# Patient Record
Sex: Male | Born: 1950 | Race: White | Hispanic: No | Smoking: Current every day smoker
Health system: Southern US, Community
[De-identification: ages and names within clinical notes are randomized; demographics above are authoritative.]

## PROBLEM LIST (undated history)

## (undated) DIAGNOSIS — T8859XA Other complications of anesthesia, initial encounter: Secondary | ICD-10-CM

## (undated) DIAGNOSIS — Z8619 Personal history of other infectious and parasitic diseases: Secondary | ICD-10-CM

## (undated) DIAGNOSIS — T4145XA Adverse effect of unspecified anesthetic, initial encounter: Secondary | ICD-10-CM

## (undated) DIAGNOSIS — K219 Gastro-esophageal reflux disease without esophagitis: Secondary | ICD-10-CM

## (undated) DIAGNOSIS — K089 Disorder of teeth and supporting structures, unspecified: Secondary | ICD-10-CM

## (undated) HISTORY — PX: WISDOM TOOTH EXTRACTION: SHX21

---

## 2011-01-06 ENCOUNTER — Ambulatory Visit: Payer: Self-pay | Admitting: Family Medicine

## 2011-12-18 ENCOUNTER — Encounter (HOSPITAL_COMMUNITY): Payer: Self-pay | Admitting: Pharmacy Technician

## 2011-12-31 ENCOUNTER — Encounter (HOSPITAL_COMMUNITY): Payer: Self-pay

## 2011-12-31 ENCOUNTER — Encounter (HOSPITAL_COMMUNITY)
Admission: RE | Admit: 2011-12-31 | Discharge: 2011-12-31 | Disposition: A | Discharge status: Home / Self Care | Payer: Medicaid Other | Source: Ambulatory Visit | Attending: Oral Surgery | Admitting: Oral Surgery

## 2011-12-31 HISTORY — DX: Personal history of other infectious and parasitic diseases: Z86.19

## 2011-12-31 HISTORY — DX: Disorder of teeth and supporting structures, unspecified: K08.9

## 2011-12-31 HISTORY — DX: Other complications of anesthesia, initial encounter: T88.59XA

## 2011-12-31 HISTORY — DX: Gastro-esophageal reflux disease without esophagitis: K21.9

## 2011-12-31 HISTORY — DX: Adverse effect of unspecified anesthetic, initial encounter: T41.45XA

## 2011-12-31 LAB — CBC
Platelets: 185 10*3/uL (ref 150–400)
RBC: 4.39 MIL/uL (ref 4.22–5.81)
RDW: 14 % (ref 11.5–15.5)
WBC: 10.4 10*3/uL (ref 4.0–10.5)

## 2011-12-31 NOTE — Pre-Procedure Instructions (Signed)
[address]  12/31/2011   Your procedure is scheduled on:  Monday, March 11  Report to [facility] at 5:30 AM.  Call this number if you have problems the morning of surgery: [phone]   Remember:   Do not eat food:After Midnight.  May have clear liquids: up to 4 Hours before arrival.  Clear liquids include soda, tea, black coffee, apple or grape juice, broth.  Take these medicines the morning of surgery with A SIP OF WATER: Protonix   Do not wear jewelry, make-up or nail polish.  Do not wear lotions, powders, or perfumes. You may wear deodorant.  Do not shave 48 hours prior to surgery.  Do not bring valuables to the hospital.  Contacts, dentures or bridgework may not be worn into surgery.  Leave suitcase in the car. After surgery it may be brought to your room.  For patients admitted to the hospital, checkout time is 11:00 AM the day of discharge.   Patients discharged the day of surgery will not be allowed to drive home.  Name and phone number of your driver:   Special Instructions: CHG Shower Use Special Wash: 1/2 bottle night before surgery and 1/2 bottle morning of surgery.   Please read over the following fact sheets that you were given: Pain Booklet, Coughing and Deep Breathing and Surgical Site Infection Prevention
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
## 2012-01-04 ENCOUNTER — Encounter (HOSPITAL_COMMUNITY): Payer: Self-pay | Admitting: Certified Registered Nurse Anesthetist

## 2012-01-04 ENCOUNTER — Ambulatory Visit (HOSPITAL_COMMUNITY): Payer: Medicaid Other | Admitting: Certified Registered Nurse Anesthetist

## 2012-01-04 ENCOUNTER — Encounter (HOSPITAL_COMMUNITY)
Admission: RE | Disposition: A | Discharge status: Home / Self Care | Payer: Self-pay | Source: Ambulatory Visit | Attending: Oral Surgery

## 2012-01-04 ENCOUNTER — Ambulatory Visit (HOSPITAL_COMMUNITY)
Admission: RE | Admit: 2012-01-04 | Discharge: 2012-01-04 | Disposition: A | Discharge status: Home / Self Care | Payer: Medicaid Other | Source: Ambulatory Visit | Attending: Oral Surgery | Admitting: Oral Surgery

## 2012-01-04 DIAGNOSIS — Z01812 Encounter for preprocedural laboratory examination: Secondary | ICD-10-CM | POA: Insufficient documentation

## 2012-01-04 DIAGNOSIS — F17200 Nicotine dependence, unspecified, uncomplicated: Secondary | ICD-10-CM | POA: Insufficient documentation

## 2012-01-04 DIAGNOSIS — K029 Dental caries, unspecified: Secondary | ICD-10-CM

## 2012-01-04 DIAGNOSIS — K006 Disturbances in tooth eruption: Secondary | ICD-10-CM | POA: Insufficient documentation

## 2012-01-04 DIAGNOSIS — J449 Chronic obstructive pulmonary disease, unspecified: Secondary | ICD-10-CM | POA: Insufficient documentation

## 2012-01-04 DIAGNOSIS — K219 Gastro-esophageal reflux disease without esophagitis: Secondary | ICD-10-CM | POA: Insufficient documentation

## 2012-01-04 DIAGNOSIS — M278 Other specified diseases of jaws: Secondary | ICD-10-CM | POA: Insufficient documentation

## 2012-01-04 DIAGNOSIS — J4489 Other specified chronic obstructive pulmonary disease: Secondary | ICD-10-CM | POA: Insufficient documentation

## 2012-01-04 DIAGNOSIS — M270 Developmental disorders of jaws: Secondary | ICD-10-CM

## 2012-01-04 HISTORY — PX: MULTIPLE EXTRACTIONS WITH ALVEOLOPLASTY: SHX5342

## 2012-01-04 SURGERY — MULTIPLE EXTRACTION WITH ALVEOLOPLASTY
Anesthesia: General | Site: Mouth | Laterality: Bilateral | Wound class: Clean Contaminated

## 2012-01-04 MED ORDER — SODIUM CHLORIDE 0.9 % IR SOLN
Status: DC | PRN
  Administered 2012-01-04: 1000 mL

## 2012-01-04 MED ORDER — LIDOCAINE HCL (CARDIAC) 20 MG/ML IV SOLN
INTRAVENOUS | Status: DC | PRN
  Administered 2012-01-04: 80 mg via INTRAVENOUS

## 2012-01-04 MED ORDER — PROPOFOL 10 MG/ML IV EMUL
INTRAVENOUS | Status: DC | PRN
  Administered 2012-01-04: 200 mg via INTRAVENOUS

## 2012-01-04 MED ORDER — ONDANSETRON HCL 4 MG/2ML IJ SOLN
INTRAMUSCULAR | Status: DC | PRN
  Administered 2012-01-04: 4 mg via INTRAVENOUS

## 2012-01-04 MED ORDER — LACTATED RINGERS IV SOLN
INTRAVENOUS | Status: DC
  Administered 2012-01-04: 10:00:00 via INTRAVENOUS

## 2012-01-04 MED ORDER — LACTATED RINGERS IV SOLN
INTRAVENOUS | Status: DC | PRN
  Administered 2012-01-04 (×2): via INTRAVENOUS

## 2012-01-04 MED ORDER — SUCCINYLCHOLINE CHLORIDE 20 MG/ML IJ SOLN
INTRAMUSCULAR | Status: DC | PRN
  Administered 2012-01-04: 100 mg via INTRAVENOUS

## 2012-01-04 MED ORDER — CLINDAMYCIN PHOSPHATE 600 MG/50ML IV SOLN
INTRAVENOUS | Status: DC | PRN
  Administered 2012-01-04: 600 mg via INTRAVENOUS

## 2012-01-04 MED ORDER — MORPHINE SULFATE 2 MG/ML IJ SOLN
1.0000 mg | INTRAMUSCULAR | Status: DC | PRN
  Administered 2012-01-04: 2 mg via INTRAVENOUS

## 2012-01-04 MED ORDER — OXYMETAZOLINE HCL 0.05 % NA SOLN
NASAL | Status: DC | PRN
  Administered 2012-01-04: 2 via NASAL

## 2012-01-04 MED ORDER — LIDOCAINE-EPINEPHRINE 2 %-1:100000 IJ SOLN
INTRAMUSCULAR | Status: DC | PRN
  Administered 2012-01-04: 18 mL

## 2012-01-04 MED ORDER — OXYCODONE-ACETAMINOPHEN 5-325 MG PO TABS: 1.0000 | ORAL_TABLET | ORAL | Status: AC | PRN

## 2012-01-04 MED ORDER — DEXTROSE 5 % IV SOLN
INTRAVENOUS | Status: AC
  Filled 2012-01-04: qty 50

## 2012-01-04 MED ORDER — FENTANYL CITRATE 0.05 MG/ML IJ SOLN
INTRAMUSCULAR | Status: DC | PRN
  Administered 2012-01-04: 100 ug via INTRAVENOUS

## 2012-01-04 MED ORDER — MEPERIDINE HCL 25 MG/ML IJ SOLN: 6.2500 mg | INTRAMUSCULAR | Status: DC | PRN

## 2012-01-04 MED ORDER — CLINDAMYCIN PHOSPHATE 600 MG/4ML IJ SOLN
INTRAMUSCULAR | Status: AC
  Filled 2012-01-04: qty 4

## 2012-01-04 MED ORDER — PHENYLEPHRINE HCL 10 MG/ML IJ SOLN
INTRAMUSCULAR | Status: DC | PRN
  Administered 2012-01-04 (×3): 40 ug via INTRAVENOUS

## 2012-01-04 MED ORDER — PROMETHAZINE HCL 25 MG/ML IJ SOLN: 6.2500 mg | INTRAMUSCULAR | Status: DC | PRN

## 2012-01-04 MED ORDER — MIDAZOLAM HCL 5 MG/5ML IJ SOLN
INTRAMUSCULAR | Status: DC | PRN
  Administered 2012-01-04: 2 mg via INTRAVENOUS

## 2012-01-04 SURGICAL SUPPLY — 36 items
BLADE SURG 15 STRL LF DISP TIS (BLADE) ×2 IMPLANT
BLADE SURG 15 STRL SS (BLADE) ×1
BUR CROSS CUT (BURR)
BUR CROSS CUT FISSURE 1.6 (BURR) ×3 IMPLANT
BUR EGG ELITE 4.0 (BURR) ×3 IMPLANT
BUR SRG MED 1.6XXCUT FSSR (BURR) IMPLANT
BUR SURG 4X8 MED (BURR) IMPLANT
BURR SRG MED 1.6XXCUT FSSR (BURR)
BURR SURG 4X8 MED (BURR)
CANISTER SUCTION 2500CC (MISCELLANEOUS) ×3 IMPLANT
CLOTH BEACON ORANGE TIMEOUT ST (SAFETY) ×3 IMPLANT
COVER SURGICAL LIGHT HANDLE (MISCELLANEOUS) ×3 IMPLANT
GAUZE PACKING FOLDED 2  STR (GAUZE/BANDAGES/DRESSINGS) ×1
GAUZE PACKING FOLDED 2 STR (GAUZE/BANDAGES/DRESSINGS) ×2 IMPLANT
GAUZE SPONGE 4X4 16PLY XRAY LF (GAUZE/BANDAGES/DRESSINGS) ×3 IMPLANT
GLOVE BIO SURGEON STRL SZ 6.5 (GLOVE) ×3 IMPLANT
GLOVE BIO SURGEON STRL SZ7 (GLOVE) ×3 IMPLANT
GLOVE BIO SURGEON STRL SZ7.5 (GLOVE) ×3 IMPLANT
GLOVE BIOGEL PI IND STRL 7.0 (GLOVE) ×4 IMPLANT
GLOVE BIOGEL PI INDICATOR 7.0 (GLOVE) ×2
GOWN STRL NON-REIN LRG LVL3 (GOWN DISPOSABLE) ×6 IMPLANT
GOWN STRL REIN XL XLG (GOWN DISPOSABLE) ×3 IMPLANT
KIT BASIN OR (CUSTOM PROCEDURE TRAY) ×3 IMPLANT
KIT ROOM TURNOVER OR (KITS) ×3 IMPLANT
NEEDLE 22X1 1/2 (OR ONLY) (NEEDLE) ×3 IMPLANT
NEEDLE BLUNT 16X1.5 OR ONLY (NEEDLE) IMPLANT
NS IRRIG 1000ML POUR BTL (IV SOLUTION) ×3 IMPLANT
PAD ARMBOARD 7.5X6 YLW CONV (MISCELLANEOUS) ×6 IMPLANT
SUT CHROMIC 3 0 PS 2 (SUTURE) ×9 IMPLANT
SYR 50ML SLIP (SYRINGE) IMPLANT
TOWEL OR 17X24 6PK STRL BLUE (TOWEL DISPOSABLE) ×3 IMPLANT
TOWEL OR 17X26 10 PK STRL BLUE (TOWEL DISPOSABLE) ×3 IMPLANT
TRAY ENT MC OR (CUSTOM PROCEDURE TRAY) ×3 IMPLANT
TUBING IRRIGATION (MISCELLANEOUS) ×3 IMPLANT
WATER STERILE IRR 1000ML POUR (IV SOLUTION) IMPLANT
YANKAUER SUCT BULB TIP NO VENT (SUCTIONS) ×3 IMPLANT

## 2012-01-04 NOTE — Anesthesia Postprocedure Evaluation (Signed)
  Anesthesia Post-op Note  Patient: Randy Benjamin  Procedure(s) Performed: Procedure(s) (LRB): MULTIPLE EXTRACION WITH ALVEOLOPLASTY (Bilateral)  Patient Location: PACU  Anesthesia Type: General  Level of Consciousness: awake  Airway and Oxygen Therapy: Patient Spontanous Breathing  Post-op Pain: mild  Post-op Assessment: Post-op Vital signs reviewed  Post-op Vital Signs: stable  Complications: No apparent anesthesia complications

## 2012-01-04 NOTE — Preoperative (Signed)
Beta Blockers   Reason not to administer Beta Blockers:Not Applicable 

## 2012-01-04 NOTE — H&P (Signed)
H&P documentation  -History and Physical Reviewed  -Patient has been re-examined  -No change in the plan of care  Randy Benjamin M  

## 2012-01-04 NOTE — Transfer of Care (Signed)
Immediate Anesthesia Transfer of Care Note  Patient: Randy Benjamin  Procedure(s) Performed: Procedure(s) (LRB): MULTIPLE EXTRACION WITH ALVEOLOPLASTY (Bilateral)  Patient Location: PACU  Anesthesia Type: General  Level of Consciousness: awake, alert , oriented and patient cooperative  Airway & Oxygen Therapy: Patient Spontanous Breathing and Patient connected to nasal cannula oxygen  Post-op Assessment: Report given to PACU RN, Post -op Vital signs reviewed and stable and Patient moving all extremities X 4  Post vital signs: Reviewed and stable  Complications: No apparent anesthesia complications

## 2012-01-04 NOTE — Anesthesia Procedure Notes (Signed)
Procedure Name: Intubation Date/Time: 01/04/2012 10:07 AM Performed by: Rogelia Boga Pre-anesthesia Checklist: Patient identified, Emergency Drugs available, Suction available, Patient being monitored and Timeout performed Patient Re-evaluated:Patient Re-evaluated prior to inductionOxygen Delivery Method: Circle system utilized Preoxygenation: Pre-oxygenation with 100% oxygen Intubation Type: IV induction Ventilation: Mask ventilation without difficulty Laryngoscope Size: Mac and 4 Grade View: Grade II Nasal Tubes: Nasal Rae, Magill forceps- large, utilized, Nasal prep performed and Left Tube size: 7.0 mm Number of attempts: 3 Placement Confirmation: ETT inserted through vocal cords under direct vision,  positive ETCO2 and breath sounds checked- equal and bilateral Tube secured with: Tape Dental Injury: Teeth and Oropharynx as per pre-operative assessment and Bloody posterior oropharynx

## 2012-01-04 NOTE — Op Note (Signed)
01/04/2012  10:57 AM  PATIENT:  Randy Benjamin  61 y.o. male  PRE-OPERATIVE DIAGNOSIS:  Nonrestorable Teeth #'s 4, 5, 11, 14, 20, 21, 22, 23, 27, 28, Bilateral Mandibular Tori  PROCEDURE: MULTIPLE EXTRACTIONS TEETH #'s 4, 5, 11, 14, 20, 21, 22, 23, 27, 28,  WITH ALVEOLOPLASTY, removal Bilateral Mandibular Tori  SURGEON:  Surgeon(s): [name], DDS  ANESTHESIA:   local and general  EBL:  minimal  DRAINS: none   LOCAL MEDICATIONS USED:  [id number]  SPECIMEN:  No Specimen  COUNTS:  YES  PLAN OF CARE: Discharge to home after PACU  PATIENT DISPOSITION:  PACU - hemodynamically stable.   PROCEDURE DETAILS: Dictation # [id number]  [name], DMD 01/04/2012 10:57 AM

## 2012-01-04 NOTE — Anesthesia Preprocedure Evaluation (Signed)
Anesthesia Evaluation  Patient identified by MRN, date of birth, ID band Patient awake    Reviewed: Allergy & Precautions, H&P , NPO status , Patient's Chart, lab work & pertinent test results  Airway Mallampati: II  Neck ROM: Full    Dental  (+) Poor Dentition   Pulmonary COPDCurrent Smoker,  breath sounds clear to auscultation        Cardiovascular negative cardio ROS  Rhythm:Regular Rate:Normal     Neuro/Psych negative neurological ROS     GI/Hepatic Neg liver ROS, GERD-  ,  Endo/Other  negative endocrine ROS  Renal/GU negative Renal ROS     Musculoskeletal   Abdominal   Peds  Hematology negative hematology ROS (+)   Anesthesia Other Findings   Reproductive/Obstetrics                           Anesthesia Physical Anesthesia Plan  ASA: II  Anesthesia Plan: General   Post-op Pain Management:    Induction: Intravenous  Airway Management Planned: Nasal ETT  Additional Equipment:   Intra-op Plan:   Post-operative Plan: Extubation in OR  Informed Consent: I have reviewed the patients History and Physical, chart, labs and discussed the procedure including the risks, benefits and alternatives for the proposed anesthesia with the patient or authorized representative who has indicated his/her understanding and acceptance.   Dental advisory given  Plan Discussed with: CRNA and Surgeon  Anesthesia Plan Comments:         Anesthesia Quick Evaluation

## 2012-01-05 NOTE — Op Note (Signed)
Randy Benjamin               ACCOUNT NO.:  [id number]  MEDICAL RECORD NO.:  [id number]  LOCATION:  [facility]                         FACILITY:  [facility]  PHYSICIAN:  Randy Benjamin, M.D.  DATE OF BIRTH:  1951-03-09  DATE OF PROCEDURE:  01/04/2012 DATE OF DISCHARGE:  01/04/2012                              OPERATIVE REPORT   PREOPERATIVE DIAGNOSES:  Nonrestorable impacted teeth #4, 5, 11, 14, 20, 21, 22, 23, 27, 28, bilateral mandibular tori.  POSTOPERATIVE DIAGNOSES:  Nonrestorable impacted teeth #4, 5, 11, 14, 20, 21, 22, 23, 27, 28, bilateral mandibular tori.  PROCEDURES:  Removal of teeth #4, 5, 11, 14, 20, 21, 22, 23, 27, 28, alveoplasty right and left maxilla and mandible, removal of bilateral mandibular tori.  SURGEON:  Randy Lopes, MD  ANESTHESIA:  General, Dr. Jacklynn Benjamin attending.  INDICATIONS FOR PROCEDURE:  Randy Benjamin is a 61 year old male who has severe dental phobia who was referred to the office by his general dentist for removal of multiple impacted and nonrestorable teeth.  Upon examination, there was complete bony impacted tooth #11 and the other teeth were all grossly decayed.  The patient had bilateral mandibular tori as well. Because of the extensiveness of the surgery and the need for adequate anesthesia, it was recommended that the patient undergo surgery at [facility] for airway protection with intubation.  PROCEDURE:  The patient was taken to the operating room, placed on the table in supine position.  General anesthesia was administered intravenously and a nasal endotracheal tube was placed and secured.  The eyes were protected.  The patient was draped for the procedure.  Time- out was performed.  Posterior pharynx was suctioned with the Yankauer suction and a throat pack was placed.  A 2% lidocaine with 1:100,000 epinephrine was infiltrated in an inferior alveolar block on the right and left side and a buccal and palatal infiltration in the maxilla around  the teeth to be removed, total of 18 mL was utilized.  Bite block was placed on the right side of the mouth, and a sweetheart retractor was used to retract the tongue.  A 15 blade was used to make a full- thickness incision in the alveolar crest in the area of the first molar on the left side, carried anteriorly to the midline, and another incision was made in the left maxilla at the distal aspect of tooth #14 and carried anteriorly to the midline as well.  The periosteum was reflected in the maxilla and the mandible with the periosteal elevator, and then the teeth were elevated with a 301 elevator.  The lower teeth were removed with the Asch forceps.  Tooth #27 fractured and additional bone was removed around this root tip to remove it.  The maxillary teeth were removed with the use of the upper universal forceps for tooth #14 and bone was removed overlying tooth #11 and the tooth was elevated with 301 elevator and removed.  Socket was then curetted.  The periosteum was further reflected in the maxilla and mandible to allow for alveoplasty to be performed with the egg-shaped bur and with a bone file.  The mandibular torus was then trimmed with the egg-shaped bur, and  soft tissues were retracted with a Seldin retractor.  Then, the oral cavity was irrigated on the left side.  Then, sutures were placed 3-0 chromic. Attention was turned to the right side of the mouth.  A 15 blade was used to make a full-thickness incision overlying the area of tooth #30 carrying anteriorly to the midline and in the maxilla beginning at the area of tooth #2 carrying anteriorly towards the midline.  The periosteum was reflected.  The teeth were elevated with 301 elevator, removed from the mouth with the dental forceps.  The periosteum and the maxilla and mandible on the right side was then further reflected and alveoplasty was performed with the egg-shaped bur.  The right mandibular torus was removed using the  Seldin retractor and the egg-shaped bur. Then, the right side of the mouth was irrigated with normal saline and sutured with 3-0 chromic.  The oral cavity was inspected and found to have good contour and closure and hemostasis.  The oral cavity was suctioned after irrigating and the throat pack was removed.  The patient was awakened and taken to the recovery room breathing spontaneously in good condition.  ESTIMATED BLOOD LOSS:  Minimum.  COMPLICATIONS:  None.  SPECIMENS:  None.     Randy Benjamin, M.D.     SMJ/MEDQ  D:  01/04/2012  T:  01/05/2012  Job:  [id number]

## 2012-01-11 ENCOUNTER — Encounter (HOSPITAL_COMMUNITY): Payer: Self-pay | Admitting: Oral Surgery

## 2015-10-23 DIAGNOSIS — S9032XA Contusion of left foot, initial encounter: Secondary | ICD-10-CM | POA: Diagnosis not present

## 2015-10-23 DIAGNOSIS — M79672 Pain in left foot: Secondary | ICD-10-CM | POA: Diagnosis not present

## 2015-10-23 DIAGNOSIS — S99922A Unspecified injury of left foot, initial encounter: Secondary | ICD-10-CM | POA: Diagnosis not present

## 2015-12-18 DIAGNOSIS — M79672 Pain in left foot: Secondary | ICD-10-CM | POA: Diagnosis not present

## 2015-12-18 DIAGNOSIS — M779 Enthesopathy, unspecified: Secondary | ICD-10-CM | POA: Diagnosis not present

## 2016-01-08 DIAGNOSIS — M779 Enthesopathy, unspecified: Secondary | ICD-10-CM | POA: Diagnosis not present

## 2016-01-08 DIAGNOSIS — M79672 Pain in left foot: Secondary | ICD-10-CM | POA: Diagnosis not present

## 2016-04-07 ENCOUNTER — Ambulatory Visit (INDEPENDENT_AMBULATORY_CARE_PROVIDER_SITE_OTHER): Payer: Medicare Other | Admitting: Family Medicine

## 2016-04-07 ENCOUNTER — Encounter: Payer: Self-pay | Admitting: Family Medicine

## 2016-04-07 VITALS — BP 153/71 | HR 67 | Temp 99.1°F | Ht 73.0 in | Wt 193.8 lb

## 2016-04-07 DIAGNOSIS — N3942 Incontinence without sensory awareness: Secondary | ICD-10-CM
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
## 2017-03-11 DIAGNOSIS — H25813 Combined forms of age-related cataract, bilateral: Secondary | ICD-10-CM | POA: Diagnosis not present

## 2017-03-11 DIAGNOSIS — Z0100 Encounter for examination of eyes and vision without abnormal findings: Secondary | ICD-10-CM | POA: Diagnosis not present

## 2017-03-11 DIAGNOSIS — H5200 Hypermetropia, unspecified eye: Secondary | ICD-10-CM | POA: Diagnosis not present

## 2017-03-30 ENCOUNTER — Ambulatory Visit (INDEPENDENT_AMBULATORY_CARE_PROVIDER_SITE_OTHER): Payer: Medicare HMO | Admitting: Family Medicine

## 2017-03-30 ENCOUNTER — Encounter: Payer: Self-pay | Admitting: Family Medicine

## 2017-03-30 ENCOUNTER — Ambulatory Visit (INDEPENDENT_AMBULATORY_CARE_PROVIDER_SITE_OTHER): Payer: Medicare HMO

## 2017-03-30 VITALS — BP 119/64 | HR 81 | Temp 99.8°F | Ht 73.0 in

## 2017-03-30 DIAGNOSIS — M25571 Pain in right ankle and joints of right foot: Secondary | ICD-10-CM

## 2017-03-30 MED ORDER — IBUPROFEN 600 MG PO TABS: 600.0000 mg | ORAL_TABLET | Freq: Three times a day (TID) | ORAL | 1 refills | Status: DC | PRN

## 2017-03-30 NOTE — Progress Notes (Signed)
Chief Complaint  Patient presents with  . Ankle Pain    HPI  Patient presents today for Pain present for several days at the right ankle. This is a new problem He points to the area of the lateral malleolus has maximally painful. He twisted it when he stepped in a uneven trench in the ground. He is having difficulty walking on it now. It's been several days but he did not have transportation to get to the office. It has been moderately painful and he's having to limp with.  PMH: Smoking status noted ROS: Review of Systems  Constitutional: Positive for malaise/fatigue. Negative for chills, diaphoresis and fever.  HENT: Negative.   Respiratory: Negative for cough and shortness of breath.   Cardiovascular: Negative for chest pain and palpitations.  Musculoskeletal: Positive for joint pain and myalgias.  Neurological: Negative for dizziness and headaches.     Objective: BP 119/64   Pulse 81   Temp 99.8 F (37.7 C) (Oral)   Ht 6\' 1"  (1.854 m)  Gen: NAD, alert, cooperative with exam HEENT: NCAT, EOMI, PERRL CV: RRR, good S1/S2, no murmur Resp: CTABL, no wheezes, non-labored Abd: SNTND, BS present, no guarding or organomegaly Ext: No edema, warm. The ankle is tender and 2+ swollen at the lateral malleolus. Neuro: Alert and oriented, No gross deficits  Assessment and plan:  1. Acute right ankle pain     Meds ordered this encounter  Medications  . ibuprofen (ADVIL,MOTRIN) 600 MG tablet    Sig: Take 1 tablet (600 mg total) by mouth every 8 (eight) hours as needed for moderate pain.    Dispense:  90 tablet    Refill:  1    Orders Placed This Encounter  Procedures  . DG Foot Complete Right    Standing Status:   Future    Number of Occurrences:   1    Standing Expiration Date:   05/30/2018    Order Specific Question:   Reason for Exam (SYMPTOM  OR DIAGNOSIS REQUIRED)    Answer:   foot pain    Order Specific Question:   Preferred imaging location?    Answer:   Internal  . DG  Ankle Complete Right    Standing Status:   Future    Number of Occurrences:   1    Standing Expiration Date:   05/30/2018    Order Specific Question:   Reason for Exam (SYMPTOM  OR DIAGNOSIS REQUIRED)    Answer:   foot pain    Order Specific Question:   Preferred imaging location?    Answer:   Internal  . Ambulatory referral to Physical Therapy    Referral Priority:   Routine    Referral Type:   Physical Medicine    Referral Reason:   Specialty Services Required    Requested Specialty:   Physical Therapy    Number of Visits Requested:   1    Cast boot dispensed. Follow up 34month Mechele ClaudeWarren Jerren Flinchbaugh, MD

## 2017-03-31 ENCOUNTER — Telehealth: Payer: Self-pay | Admitting: Family Medicine

## 2017-03-31 ENCOUNTER — Encounter: Payer: Self-pay | Admitting: Family Medicine

## 2017-03-31 DIAGNOSIS — M25571 Pain in right ankle and joints of right foot: Secondary | ICD-10-CM | POA: Diagnosis not present

## 2017-03-31 NOTE — Telephone Encounter (Signed)
I only met him yesterday - for a few minutes to assess his ankle. He will have to come in for a check up for that. So Sorry! WS

## 2017-03-31 NOTE — Telephone Encounter (Signed)
Pt would like Rx for night time urination esp now that he has to get up with cast boot on and does not want to have any accidents Uses [facility] [place]

## 2017-04-20 NOTE — Telephone Encounter (Signed)
Detailed message left for patient to please call back if still needs to be seen.

## 2018-05-28 DIAGNOSIS — Z886 Allergy status to analgesic agent status: Secondary | ICD-10-CM | POA: Diagnosis not present

## 2018-05-28 DIAGNOSIS — Z880 Allergy status to penicillin: Secondary | ICD-10-CM | POA: Diagnosis not present

## 2018-05-28 DIAGNOSIS — F17210 Nicotine dependence, cigarettes, uncomplicated: Secondary | ICD-10-CM | POA: Diagnosis not present

## 2018-05-28 DIAGNOSIS — H1032 Unspecified acute conjunctivitis, left eye: Secondary | ICD-10-CM | POA: Diagnosis not present

## 2018-05-30 DIAGNOSIS — H00024 Hordeolum internum left upper eyelid: Secondary | ICD-10-CM | POA: Diagnosis not present

## 2018-07-01 ENCOUNTER — Telehealth: Payer: Self-pay | Admitting: Family Medicine

## 2018-11-17 DIAGNOSIS — H00024 Hordeolum internum left upper eyelid: Secondary | ICD-10-CM | POA: Diagnosis not present

## 2018-11-17 DIAGNOSIS — H524 Presbyopia: Secondary | ICD-10-CM | POA: Diagnosis not present

## 2018-11-17 DIAGNOSIS — Z0100 Encounter for examination of eyes and vision without abnormal findings: Secondary | ICD-10-CM | POA: Diagnosis not present

## 2019-01-30 IMAGING — DX DG FOOT COMPLETE 3+V*R*
3 series · 3 of 3 positions shown · non-contrast
Comparison: None.

CLINICAL DATA: Twisted foot in hole. Right foot pain. Initial
encounter.

EXAM:
RIGHT FOOT COMPLETE - 3+ VIEW

[foot ap]
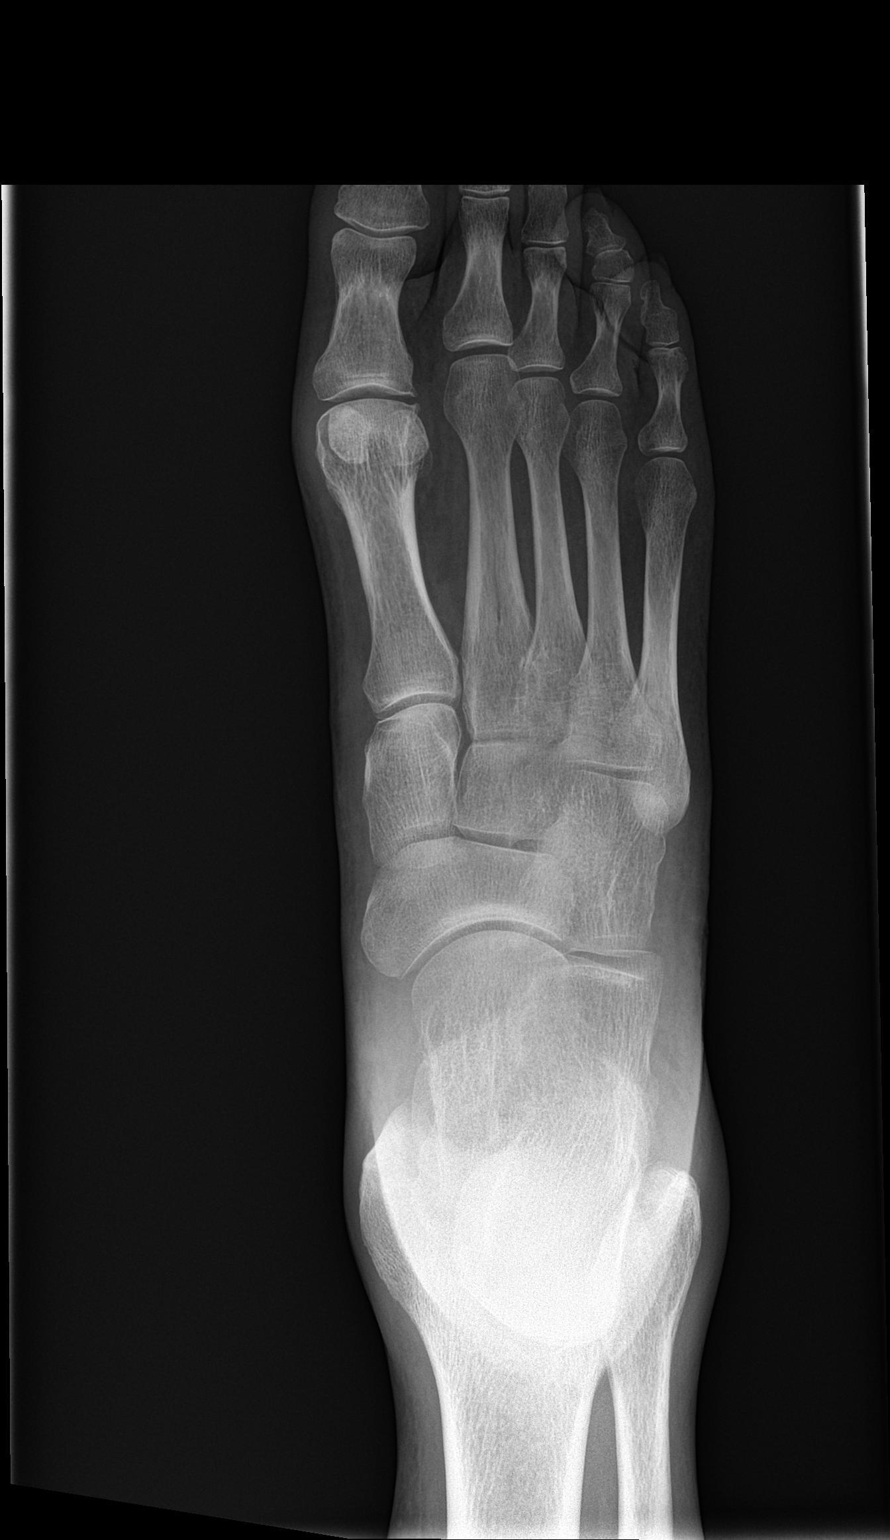

[foot obl]
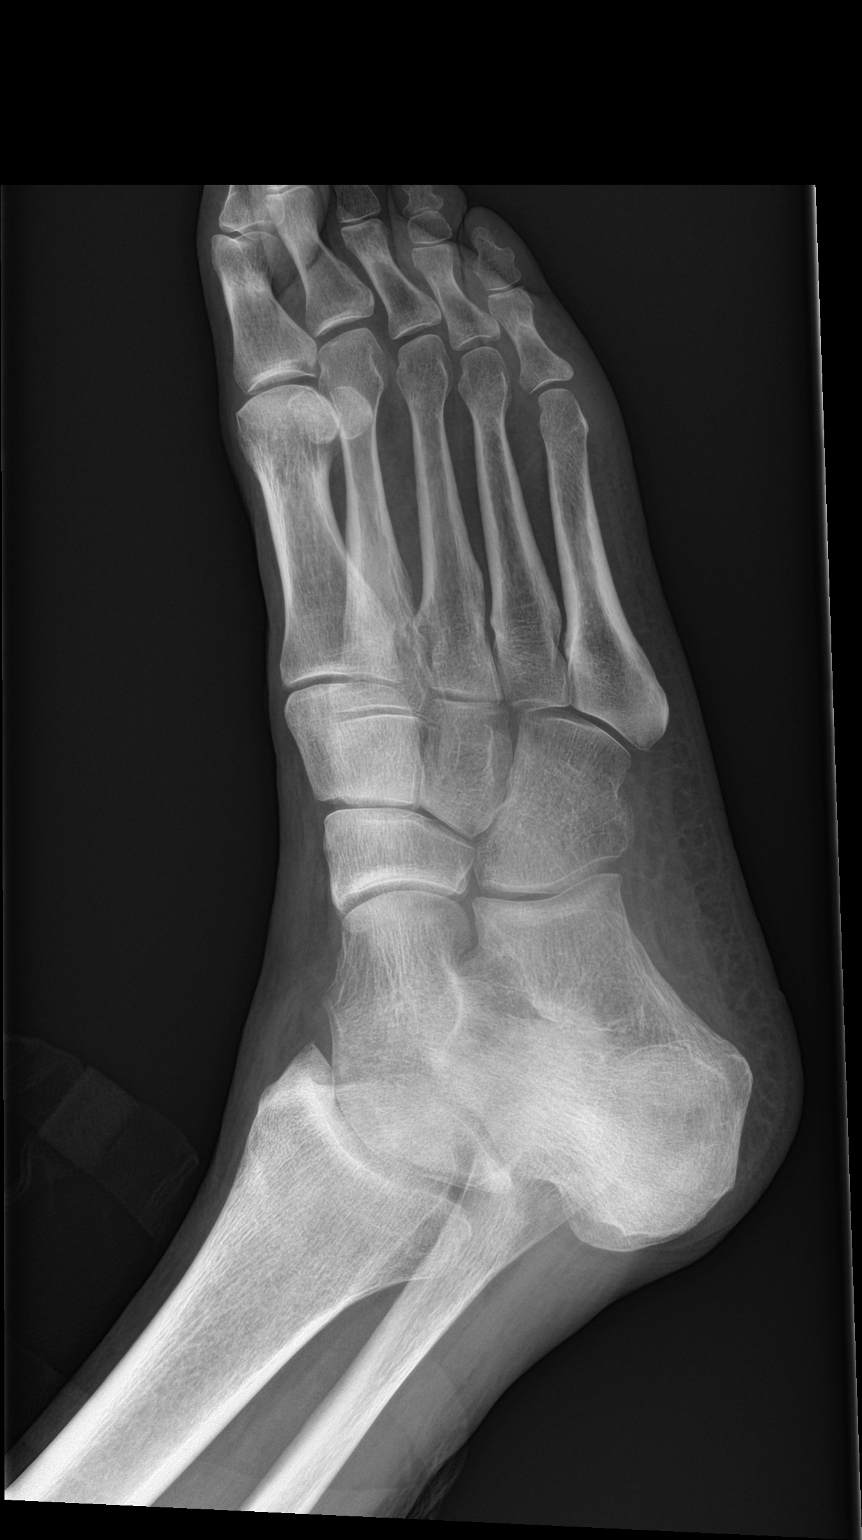

[foot lat]
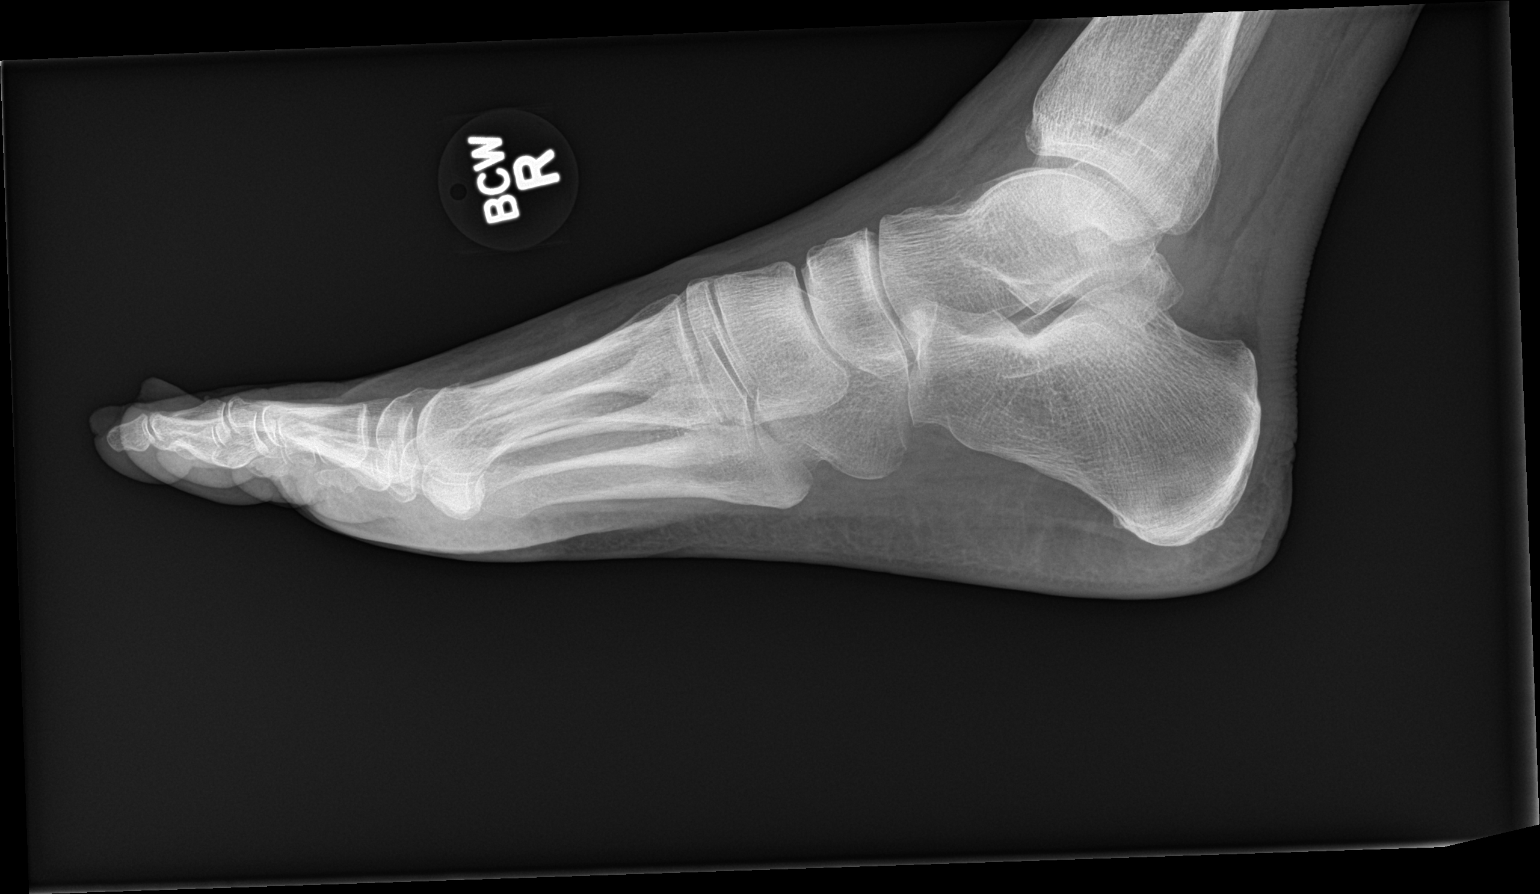

[3 of 3 positions shown; findings below may reference images not displayed]

FINDINGS: There is no evidence of fracture or dislocation. Early degenerative
spurring is seen along the lateral aspect of the first MTP joint. No
evidence of joint space narrowing. No other osseous abnormality
identified. Soft tissues are unremarkable.
IMPRESSION: No acute findings.

## 2020-01-16 ENCOUNTER — Ambulatory Visit (INDEPENDENT_AMBULATORY_CARE_PROVIDER_SITE_OTHER): Payer: Medicare HMO | Admitting: Family Medicine

## 2020-01-16 ENCOUNTER — Encounter: Payer: Self-pay | Admitting: Family Medicine

## 2020-01-16 ENCOUNTER — Other Ambulatory Visit: Payer: Self-pay

## 2020-01-16 VITALS — BP 177/96 | HR 81 | Temp 97.8°F | Ht 73.0 in | Wt 210.0 lb

## 2020-01-16 DIAGNOSIS — Z1159 Encounter for screening for other viral diseases: Secondary | ICD-10-CM | POA: Diagnosis not present

## 2020-01-16 DIAGNOSIS — Z114 Encounter for screening for human immunodeficiency virus [HIV]: Secondary | ICD-10-CM | POA: Diagnosis not present

## 2020-01-16 DIAGNOSIS — N481 Balanitis: Secondary | ICD-10-CM | POA: Diagnosis not present

## 2020-01-16 NOTE — Progress Notes (Signed)
Subjective:  Patient ID: Randy Benjamin, male    DOB: 22-Jul-1951  Age: 69 y.o. MRN: [id number]  CC: Referral (Urology)   HPI Randy Benjamin presents for pain and swelling at the penile foreskin.  This is been ongoing for a matter of few years.  He is unable to have sex due to pain he is also concerned that perhaps he would give something to a partner.  Today he asked for referral for circumcision.  He says if anything touches him at the foreskin it causes severe pain.  He has had to wear silky underwear to avoid pain with simple contact.  Sometimes he will get blistering that will be very painful.  Depression screen [facility] 2/9 01/16/2020 03/30/2017 04/07/2016  Decreased Interest 0 0 0  Down, Depressed, Hopeless 0 0 0  PHQ - 2 Score 0 0 0    History [name] has a past medical history of Complication of anesthesia, GERD (gastroesophageal reflux disease), typhoid fever, and Poor dentition.   He has a past surgical history that includes Wisdom tooth extraction and Multiple extractions with alveoloplasty (01/04/2012).   His family history is not on file.He reports that he has been smoking. He has been smoking about 0.25 packs per day. He has never used smokeless tobacco. He reports current alcohol use. No history on file for drug.    ROS Review of Systems  Objective:  BP (!) 177/96   Pulse 81   Temp 97.8 F (36.6 C) (Temporal)   Ht 6' 1"  (1.854 m)   Wt 210 lb (95.3 kg)   BMI 27.71 kg/m   BP Readings from Last 3 Encounters:  01/16/20 (!) 177/96  03/30/17 119/64  04/07/16 (!) 153/71    Wt Readings from Last 3 Encounters:  01/16/20 210 lb (95.3 kg)  04/07/16 193 lb 12.8 oz (87.9 kg)  12/31/11 196 lb 8 oz (89.1 kg)     Physical Exam Vitals reviewed.  Constitutional:      Appearance: He is well-developed.  HENT:     Head: Normocephalic and atraumatic.     Right Ear: External ear normal.     Left Ear: External ear normal.     Mouth/Throat:     Pharynx: No oropharyngeal  exudate or posterior oropharyngeal erythema.  Eyes:     Pupils: Pupils are equal, round, and reactive to light.  Cardiovascular:     Rate and Rhythm: Normal rate and regular rhythm.     Heart sounds: No murmur.  Pulmonary:     Effort: No respiratory distress.     Breath sounds: Normal breath sounds.  Musculoskeletal:     Cervical back: Normal range of motion and neck supple.  Neurological:     Mental Status: He is alert and oriented to person, place, and time.       Assessment & Plan:   [name] was seen today for referral.  Diagnoses and all orders for this visit:  Balanitis -     CBC with Differential/Platelet -     CMP14+EGFR -     Ambulatory referral to Urology  Need for hepatitis C screening test -     Hepatitis C antibody  Encounter for screening for HIV -     HIV Antibody (routine testing w rflx)       I am having Randy Benjamin maintain his ibuprofen.  Allergies as of 01/16/2020      Reactions   Aspirin Swelling   Mouth swells   Catfish [fish  Allergy] Swelling   Penicillins Swelling   Stomach pain and rash      Medication List       Accurate as of January 16, 2020  1:54 PM. If you have any questions, ask your nurse or doctor.        ibuprofen 600 MG tablet Commonly known as: ADVIL Take 1 tablet (600 mg total) by mouth every 8 (eight) hours as needed for moderate pain.        Follow-up: Return if symptoms worsen or fail to improve.  Claretta Fraise, M.D.

## 2020-01-17 ENCOUNTER — Telehealth: Payer: Self-pay | Admitting: Family Medicine

## 2020-01-17 LAB — CBC WITH DIFFERENTIAL/PLATELET
Basophils Absolute: 0.1 10*3/uL (ref 0.0–0.2)
Basos: 1 %
EOS (ABSOLUTE): 0.3 10*3/uL (ref 0.0–0.4)
Eos: 3 %
Hematocrit: 43.4 % (ref 37.5–51.0)
Hemoglobin: 15 g/dL (ref 13.0–17.7)
Immature Grans (Abs): 0.1 10*3/uL (ref 0.0–0.1)
Immature Granulocytes: 1 %
Lymphocytes Absolute: 3.2 10*3/uL — ABNORMAL HIGH (ref 0.7–3.1)
Lymphs: 33 %
MCH: 32.8 pg (ref 26.6–33.0)
MCHC: 34.6 g/dL (ref 31.5–35.7)
MCV: 95 fL (ref 79–97)
Monocytes Absolute: 0.7 10*3/uL (ref 0.1–0.9)
Monocytes: 7 %
Neutrophils Absolute: 5.5 10*3/uL (ref 1.4–7.0)
Neutrophils: 55 %
Platelets: 178 10*3/uL (ref 150–450)
RBC: 4.57 x10E6/uL (ref 4.14–5.80)
RDW: 13.8 % (ref 11.6–15.4)
WBC: 9.8 10*3/uL (ref 3.4–10.8)

## 2020-01-17 LAB — CMP14+EGFR
ALT: 18 IU/L (ref 0–44)
AST: 14 IU/L (ref 0–40)
Albumin/Globulin Ratio: 1.5 (ref 1.2–2.2)
Albumin: 4.3 g/dL (ref 3.8–4.8)
Alkaline Phosphatase: 52 IU/L (ref 39–117)
BUN/Creatinine Ratio: 13 (ref 10–24)
BUN: 13 mg/dL (ref 8–27)
Bilirubin Total: 0.4 mg/dL (ref 0.0–1.2)
CO2: 20 mmol/L (ref 20–29)
Calcium: 9 mg/dL (ref 8.6–10.2)
Chloride: 109 mmol/L — ABNORMAL HIGH (ref 96–106)
Creatinine, Ser: 0.99 mg/dL (ref 0.76–1.27)
GFR calc Af Amer: 89 mL/min/{1.73_m2} (ref >59)
GFR calc non Af Amer: 77 mL/min/{1.73_m2} (ref >59)
Globulin, Total: 2.8 g/dL (ref 1.5–4.5)
Glucose: 100 mg/dL — ABNORMAL HIGH (ref 65–99)
Potassium: 4.4 mmol/L (ref 3.5–5.2)
Sodium: 141 mmol/L (ref 134–144)
Total Protein: 7.1 g/dL (ref 6.0–8.5)

## 2020-01-17 LAB — HEPATITIS C ANTIBODY: Hep C Virus Ab: <0.1 s/co ratio (ref 0.0–0.9)

## 2020-01-17 LAB — HIV ANTIBODY (ROUTINE TESTING W REFLEX): HIV Screen 4th Generation wRfx: NONREACTIVE

## 2020-01-17 NOTE — Progress Notes (Signed)
Hello Randy Benjamin,  Your lab result is normal and/or stable.Some minor variations that are not significant are commonly marked abnormal, but do not represent any medical problem for you.  Best regards, Essa Malachi, M.D.

## 2020-01-18 NOTE — Telephone Encounter (Signed)
rc for Courtney °

## 2020-03-14 DIAGNOSIS — N400 Enlarged prostate without lower urinary tract symptoms: Secondary | ICD-10-CM | POA: Diagnosis not present

## 2020-03-14 DIAGNOSIS — N481 Balanitis: Secondary | ICD-10-CM | POA: Diagnosis not present

## 2020-05-23 DIAGNOSIS — N481 Balanitis: Secondary | ICD-10-CM | POA: Diagnosis not present

## 2020-05-23 DIAGNOSIS — N400 Enlarged prostate without lower urinary tract symptoms: Secondary | ICD-10-CM | POA: Diagnosis not present

## 2020-07-22 ENCOUNTER — Other Ambulatory Visit: Payer: Self-pay | Admitting: Family Medicine

## 2020-07-22 NOTE — Telephone Encounter (Signed)
  Prescription Request  07/22/2020  What is the name of the medication or equipment? Pt wants a teeter-totter for his back.  Have you contacted your pharmacy to request a refill? (if applicable) no  Which pharmacy would you like this sent to? Pt doesn't know where to get this. He said Francine Graven knows where to get this    Patient notified that their request is being sent to the clinical staff for review and that they should receive a response within 2 business days.

## 2020-08-05 ENCOUNTER — Other Ambulatory Visit: Payer: Self-pay

## 2020-08-05 ENCOUNTER — Encounter: Payer: Self-pay | Admitting: Family Medicine

## 2020-08-05 ENCOUNTER — Ambulatory Visit (INDEPENDENT_AMBULATORY_CARE_PROVIDER_SITE_OTHER): Payer: Medicare HMO | Admitting: Family Medicine

## 2020-08-05 VITALS — BP 131/80 | HR 75 | Temp 97.5°F | Ht 73.0 in | Wt 209.8 lb

## 2020-08-05 DIAGNOSIS — M5416 Radiculopathy, lumbar region: Secondary | ICD-10-CM

## 2020-08-05 NOTE — Progress Notes (Signed)
   Subjective:  Patient ID: Randy Benjamin, male    DOB: [birth year]  Age: 69 y.o. MRN: [id number]  CC: Follow-up   HPI Randy Benjamin presents for  Low back pain.Wants inversion table. WOuld like a scrip to help cover it. Point of origin is at right L3-4 area. Radiates to R lateral thigh.  Depression screen [facility] 2/9 10/11/Benjamin 3/23/Benjamin 03/30/2017  Decreased Interest 0 0 0  Down, Depressed, Hopeless 0 0 0  PHQ - 2 Score 0 0 0    History Randy Benjamin has a past medical history of Complication of anesthesia, GERD (gastroesophageal reflux disease), typhoid fever, and Poor dentition.   He has a past surgical history that includes Wisdom tooth extraction and Multiple extractions with alveoloplasty (01/04/2012).   His family history is not on file.He reports that he has been smoking. He has been smoking about 0.25 packs per day. He has never used smokeless tobacco. He reports current alcohol use. No history on file for drug use.    ROS Review of Systems  Constitutional: Positive for activity change.  Musculoskeletal: Positive for arthralgias, back pain and gait problem.    Objective:  BP 131/80   Pulse 75   Temp (!) 97.5 F (36.4 C) (Temporal)   Ht 6\' 1"  (1.854 m)   Wt 209 lb 12.8 oz (95.2 kg)   BMI 27.68 kg/m   BP Readings from Last 3 Encounters:  08/05/20 131/80  01/16/20 (!) 177/96  03/30/17 119/64    Wt Readings from Last 3 Encounters:  08/05/20 209 lb 12.8 oz (95.2 kg)  01/16/20 210 lb (95.3 kg)  04/07/16 193 lb 12.8 oz (87.9 kg)     Physical Exam Constitutional:      General: He is not in acute distress.    Appearance: Normal appearance. He is not toxic-appearing.  HENT:     Head: Normocephalic.  Musculoskeletal:        General: Tenderness (lumbar musculature) present. Normal range of motion.  Neurological:     Mental Status: He is alert.       Assessment & Plan:   There are no diagnoses linked to this encounter.     I am having Randy Benjamin  maintain his ibuprofen.  Allergies as of 10/11/Benjamin      Reactions   Aspirin Swelling   Mouth swells   Catfish [fish Allergy] Swelling   Penicillins Swelling   Stomach pain and rash      Medication List       Accurate as of Randy 11, Benjamin  3:56 PM. If you have any questions, ask your nurse or doctor.        ibuprofen 600 MG tablet Commonly known as: ADVIL Take 1 tablet (600 mg total) by mouth every 8 (eight) hours as needed for moderate pain.        Follow-up: No follow-ups on file.  Randy Benjamin, M.D.

## 2020-08-11 ENCOUNTER — Encounter: Payer: Self-pay | Admitting: Family Medicine

## 2020-09-04 ENCOUNTER — Ambulatory Visit (INDEPENDENT_AMBULATORY_CARE_PROVIDER_SITE_OTHER): Payer: Medicare HMO

## 2020-09-04 ENCOUNTER — Other Ambulatory Visit: Payer: Self-pay

## 2020-09-04 DIAGNOSIS — Z23 Encounter for immunization: Secondary | ICD-10-CM

## 2020-09-04 NOTE — Progress Notes (Signed)
Patient given first COVID pfizer vaccine tolerated well.

## 2020-09-25 ENCOUNTER — Telehealth: Payer: Self-pay

## 2020-09-25 ENCOUNTER — Other Ambulatory Visit: Payer: Self-pay

## 2020-09-25 ENCOUNTER — Ambulatory Visit (INDEPENDENT_AMBULATORY_CARE_PROVIDER_SITE_OTHER): Payer: Medicare HMO

## 2020-09-25 DIAGNOSIS — Z23 Encounter for immunization: Secondary | ICD-10-CM

## 2020-09-25 NOTE — Progress Notes (Signed)
   Covid-19 Vaccination Clinic  Name:  Randy Benjamin    MRN: [id number] DOB: 03-23-1951  09/25/2020  [name] was observed post Covid-19 immunization for 15 minutes without incident. He was provided with Vaccine Information Sheet and instruction to access the V-Safe system.   [name] was instructed to call 911 with any severe reactions post vaccine: Marland Kitchen Difficulty breathing  . Swelling of face and throat  . A fast heartbeat  . A bad rash all over body  . Dizziness and weakness   Immunizations Administered    Name Date Dose VIS Date Route   Pfizer COVID-19 Vaccine 09/25/2020 12:04 PM 0.3 mL 08/14/2020 Intramuscular   Manufacturer: [employer], [employer]   Lot: [id number]   NDC: [address]-0

## 2020-09-26 ENCOUNTER — Telehealth: Payer: Self-pay

## 2020-09-26 NOTE — Telephone Encounter (Signed)
Patient states that he is having some soreness and feels like a knot on arm after receiving COVID vaccine yesterday.  Advised patient to use heat and ice for any swelling and to contact this office if symptoms worsen.

## 2020-09-27 ENCOUNTER — Other Ambulatory Visit: Payer: Self-pay | Admitting: Family Medicine

## 2020-09-27 DIAGNOSIS — M5416 Radiculopathy, lumbar region: Secondary | ICD-10-CM

## 2020-09-27 NOTE — Telephone Encounter (Signed)
I ordered the x-ray.  He can come back for his convenience.

## 2020-09-30 ENCOUNTER — Telehealth: Payer: Self-pay

## 2020-09-30 NOTE — Telephone Encounter (Signed)
No. To my knowledge we don't do that. I told him it might be helpful. I didn't plan to order one though.

## 2020-09-30 NOTE — Telephone Encounter (Signed)
Please advise 

## 2020-09-30 NOTE — Telephone Encounter (Signed)
Left detailed message per dpr  

## 2020-10-01 NOTE — Telephone Encounter (Signed)
Attempted to contact patient - NVM 

## 2020-10-14 DIAGNOSIS — H2513 Age-related nuclear cataract, bilateral: Secondary | ICD-10-CM | POA: Diagnosis not present

## 2020-10-14 DIAGNOSIS — H524 Presbyopia: Secondary | ICD-10-CM | POA: Diagnosis not present

## 2020-10-14 DIAGNOSIS — H43812 Vitreous degeneration, left eye: Secondary | ICD-10-CM | POA: Diagnosis not present

## 2022-02-28 DIAGNOSIS — E119 Type 2 diabetes mellitus without complications: Secondary | ICD-10-CM | POA: Diagnosis not present

## 2022-02-28 DIAGNOSIS — Z886 Allergy status to analgesic agent status: Secondary | ICD-10-CM | POA: Diagnosis not present

## 2022-02-28 DIAGNOSIS — E1165 Type 2 diabetes mellitus with hyperglycemia: Secondary | ICD-10-CM | POA: Diagnosis not present

## 2022-02-28 DIAGNOSIS — R338 Other retention of urine: Secondary | ICD-10-CM | POA: Diagnosis not present

## 2022-02-28 DIAGNOSIS — J45909 Unspecified asthma, uncomplicated: Secondary | ICD-10-CM | POA: Diagnosis not present

## 2022-02-28 DIAGNOSIS — Q638 Other specified congenital malformations of kidney: Secondary | ICD-10-CM | POA: Diagnosis not present

## 2022-02-28 DIAGNOSIS — Z880 Allergy status to penicillin: Secondary | ICD-10-CM | POA: Diagnosis not present

## 2022-02-28 DIAGNOSIS — R739 Hyperglycemia, unspecified: Secondary | ICD-10-CM | POA: Insufficient documentation

## 2022-02-28 DIAGNOSIS — R059 Cough, unspecified: Secondary | ICD-10-CM | POA: Diagnosis not present

## 2022-02-28 DIAGNOSIS — Z602 Problems related to living alone: Secondary | ICD-10-CM | POA: Diagnosis not present

## 2022-02-28 DIAGNOSIS — Z794 Long term (current) use of insulin: Secondary | ICD-10-CM | POA: Diagnosis not present

## 2022-02-28 DIAGNOSIS — Z79899 Other long term (current) drug therapy: Secondary | ICD-10-CM | POA: Diagnosis not present

## 2022-02-28 DIAGNOSIS — Z7984 Long term (current) use of oral hypoglycemic drugs: Secondary | ICD-10-CM | POA: Diagnosis not present

## 2022-02-28 DIAGNOSIS — N179 Acute kidney failure, unspecified: Secondary | ICD-10-CM | POA: Diagnosis not present

## 2022-02-28 DIAGNOSIS — R52 Pain, unspecified: Secondary | ICD-10-CM | POA: Diagnosis not present

## 2022-02-28 DIAGNOSIS — Z91013 Allergy to seafood: Secondary | ICD-10-CM | POA: Diagnosis not present

## 2022-02-28 DIAGNOSIS — E785 Hyperlipidemia, unspecified: Secondary | ICD-10-CM | POA: Diagnosis not present

## 2022-02-28 DIAGNOSIS — E875 Hyperkalemia: Secondary | ICD-10-CM | POA: Diagnosis not present

## 2022-02-28 DIAGNOSIS — N401 Enlarged prostate with lower urinary tract symptoms: Secondary | ICD-10-CM | POA: Insufficient documentation

## 2022-02-28 DIAGNOSIS — I4891 Unspecified atrial fibrillation: Secondary | ICD-10-CM | POA: Diagnosis not present

## 2022-02-28 DIAGNOSIS — Z20822 Contact with and (suspected) exposure to covid-19: Secondary | ICD-10-CM | POA: Diagnosis not present

## 2022-02-28 DIAGNOSIS — J449 Chronic obstructive pulmonary disease, unspecified: Secondary | ICD-10-CM | POA: Diagnosis not present

## 2022-02-28 DIAGNOSIS — J9811 Atelectasis: Secondary | ICD-10-CM | POA: Diagnosis not present

## 2022-02-28 DIAGNOSIS — K219 Gastro-esophageal reflux disease without esophagitis: Secondary | ICD-10-CM | POA: Diagnosis not present

## 2022-02-28 DIAGNOSIS — E7849 Other hyperlipidemia: Secondary | ICD-10-CM | POA: Diagnosis not present

## 2022-02-28 DIAGNOSIS — Z7901 Long term (current) use of anticoagulants: Secondary | ICD-10-CM | POA: Diagnosis not present

## 2022-02-28 LAB — COMPREHENSIVE METABOLIC PANEL: eGFR: 35

## 2022-02-28 LAB — HEMOGLOBIN A1C: Hemoglobin A1C: 11.9

## 2022-02-28 LAB — BASIC METABOLIC PANEL
BUN: 50 — AB (ref 4–21)
Creatinine: 2 — AB (ref 0.6–1.3)

## 2022-03-01 DIAGNOSIS — J449 Chronic obstructive pulmonary disease, unspecified: Secondary | ICD-10-CM | POA: Insufficient documentation

## 2022-03-01 DIAGNOSIS — E7849 Other hyperlipidemia: Secondary | ICD-10-CM | POA: Insufficient documentation

## 2022-03-01 DIAGNOSIS — K219 Gastro-esophageal reflux disease without esophagitis: Secondary | ICD-10-CM | POA: Insufficient documentation

## 2022-03-02 LAB — COMPREHENSIVE METABOLIC PANEL: eGFR: 75

## 2022-03-05 ENCOUNTER — Telehealth: Payer: Self-pay

## 2022-03-05 NOTE — Telephone Encounter (Signed)
Transition Care Management Unsuccessful Follow-up Telephone Call ? ?Date of discharge and from where:  03/04/22 - [facility] - hyperglycemia - new onset A.Fib. ? ?Attempts:  1st Attempt ? ?Reason for unsuccessful TCM follow-up call:  Unable to leave message doesn't ring - just goes straight to this message stating VM is not yet set up ? ? Transition Care Management Unsuccessful Follow-up Telephone Call ? ?Date of discharge and from where:  03/04/2022 - [facility] - hyperglycemia - new A.Fib. ? ?Attempts:  2nd Attempt ? ?Reason for unsuccessful TCM follow-up call:  Left voice message for "friend, Carney Bern" who is listed in patient contacts. ? ?  ?

## 2022-03-06 ENCOUNTER — Telehealth: Payer: Self-pay | Admitting: Family Medicine

## 2022-03-06 DIAGNOSIS — R739 Hyperglycemia, unspecified: Secondary | ICD-10-CM | POA: Diagnosis not present

## 2022-03-06 NOTE — Telephone Encounter (Signed)
Patient said that his machine to check his blood sugar is changing along with his test strips. He would like to know if this is a change that Dr Livia Snellen made or something that the pharmacy is doing. Please call back.  ?

## 2022-03-06 NOTE — Telephone Encounter (Signed)
Transition Care Management Follow-up Telephone Call ?Date of discharge and from where: 03/04/22 - [facility] - hyperglycemia, A.Fib ?How have you been since you were released from the hospital? He just doesn't feel well  ?Any questions or concerns? Yes - he passed out at the store earlier - drank some chocolate milk and felt better - can't figure out how to check his sugar. Has appt with Endocrinologist 5/19 ? ?Items Reviewed: ?Did the pt receive and understand the discharge instructions provided?  He received them, but doesn't understand everything ?Medications obtained and verified?  Missing several that were listed in d/c ?Other? No  ?Any new allergies since your discharge? No  ?Dietary orders reviewed? Yes ?Do you have support at home? Yes  ? ?Home Care and Equipment/Supplies: ?Were home health services ordered? no ? ?Were any new equipment or medical supplies ordered?  No ? ?Functional Questionnaire: (I = Independent and D = Dependent) ? ?ADLs: I ? ?Bathing/Dressing- I ? ?Meal Prep- I ? ?Eating- I ? ?Maintaining continence- I ? ?Transferring/Ambulation- I ? ?Managing Meds- I ? ?Follow up appointments reviewed: ? ?PCP Hospital f/u appt confirmed? Yes  Scheduled to see Stacks on 03/09/22 @ 11:50. ?Specialist Hospital f/u appt confirmed? Yes  Scheduled to see Ronny Bacon, NP on 03/13/22 @ 9. ?Are transportation arrangements needed? No  ?If their condition worsens, is the pt aware to call PCP or go to the Emergency Dept.? Yes ?Was the patient provided with contact information for the PCP's office or ED? Yes ?Was to pt encouraged to call back with questions or concerns? Yes  ?

## 2022-03-09 ENCOUNTER — Encounter: Payer: Self-pay | Admitting: Family Medicine

## 2022-03-09 ENCOUNTER — Ambulatory Visit (INDEPENDENT_AMBULATORY_CARE_PROVIDER_SITE_OTHER): Payer: Medicare Other | Admitting: Family Medicine

## 2022-03-09 VITALS — BP 107/68 | HR 73 | Temp 97.7°F | Ht 73.0 in | Wt 202.8 lb

## 2022-03-09 DIAGNOSIS — I480 Paroxysmal atrial fibrillation: Secondary | ICD-10-CM

## 2022-03-09 DIAGNOSIS — E11311 Type 2 diabetes mellitus with unspecified diabetic retinopathy with macular edema: Secondary | ICD-10-CM

## 2022-03-09 MED ORDER — ATORVASTATIN CALCIUM 40 MG PO TABS
40.0000 mg | ORAL_TABLET | Freq: Every day | ORAL | 3 refills | Status: AC
Start: 2022-03-09 — End: ?

## 2022-03-09 NOTE — Telephone Encounter (Signed)
It wasn't done by me. Frequently insurance companies change what they will cover. That is my best guess.Let me know if I can help. ?

## 2022-03-09 NOTE — Progress Notes (Signed)
? ?Subjective:  ?Patient ID: Randy Benjamin, male    DOB: October 17, 1951  Age: 71 y.o. MRN: [id number] ? ?CC: Diabetes ? ? ?HPI ?Randy Benjamin presents for follow up on hospitalization. New onset diabetes. Lost appetite. Started 1 week ago. Having nocturia. Was at [facility] for 4-5 days. "Out of it." Says glucose was 1500 in ambulance. Sheriff was going to arrest him for unruly behavior. ? ?Also noted to be in A. Fib with RVR. Placed on metoprolol and eliquis. Denies chest pain, paalpitations and dyspnea. No focal neuro changes ? ? ? ? ?  08/05/2020  ?  3:44 PM 01/16/2020  ?  1:14 PM 03/30/2017  ?  5:04 PM  ?Depression screen PHQ 2/9  ?Decreased Interest 0 0 0  ?Down, Depressed, Hopeless 0 0 0  ?PHQ - 2 Score 0 0 0  ? ? ?History ?Randy Benjamin has a past medical history of Complication of anesthesia, GERD (gastroesophageal reflux disease), typhoid fever, and Poor dentition.  ? ?He has a past surgical history that includes Wisdom tooth extraction and Multiple extractions with alveoloplasty (01/04/2012).  ? ?His family history is not on file.He reports that he has been smoking cigarettes. He has been smoking an average of .25 packs per day. He has never used smokeless tobacco. He reports current alcohol use. No history on file for drug use. ? ? ? ?ROS ?Review of Systems  ?Constitutional: Negative.   ?HENT: Negative.    ?Eyes:  Positive for visual disturbance (blurred).  ?Respiratory:  Negative for cough and shortness of breath.   ?Cardiovascular:  Negative for chest pain and leg swelling.  ?Gastrointestinal:  Negative for abdominal pain, diarrhea, nausea and vomiting.  ?Genitourinary:  Negative for difficulty urinating.  ?Musculoskeletal:  Negative for arthralgias and myalgias.  ?Skin:  Negative for rash.  ?Neurological:  Negative for headaches.  ?Psychiatric/Behavioral:  Negative for sleep disturbance.   ? ?Objective:  ?BP 107/68   Pulse 73   Temp 97.7 ?F (36.5 ?C)   Ht 6' 1"  (1.854 m)   Wt 202 lb 12.8 oz (92 kg)   SpO2 95%    BMI 26.76 kg/m?  ? ?BP Readings from Last 3 Encounters:  ?03/09/22 107/68  ?08/05/20 131/80  ?01/16/20 (!) 177/96  ? ? ?Wt Readings from Last 3 Encounters:  ?03/09/22 202 lb 12.8 oz (92 kg)  ?08/05/20 209 lb 12.8 oz (95.2 kg)  ?01/16/20 210 lb (95.3 kg)  ? ? ? ?Physical Exam ?Vitals reviewed.  ?Constitutional:   ?   Appearance: He is well-developed.  ?HENT:  ?   Head: Normocephalic and atraumatic.  ?   Right Ear: External ear normal.  ?   Left Ear: External ear normal.  ?   Mouth/Throat:  ?   Pharynx: No oropharyngeal exudate or posterior oropharyngeal erythema.  ?Eyes:  ?   Pupils: Pupils are equal, round, and reactive to light.  ?Cardiovascular:  ?   Rate and Rhythm: Normal rate and regular rhythm.  ?   Heart sounds: No murmur heard. ?Pulmonary:  ?   Effort: No respiratory distress.  ?   Breath sounds: Normal breath sounds.  ?Musculoskeletal:  ?   Cervical back: Normal range of motion and neck supple.  ?Neurological:  ?   Mental Status: He is alert and oriented to person, place, and time.  ? ? ? ? ?Assessment & Plan:  ? ?Randy Benjamin was seen today for diabetes. ? ?Diagnoses and all orders for this visit: ? 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 ?Paroxysmal atrial fibrillation (HCC) ? ?Other orders ?-     atorvastatin (LIPITOR) 40 MG tablet; Take 1 tablet (40 mg total) by mouth daily. ? ? ? ? ? ? ?I have discontinued [place] Pall ibuprofen, tamsulosin, oxybutynin, mirtazapine, and Myrbetriq. I have also changed his atorvastatin. Additionally, I am having him maintain his apixaban, pantoprazole, metoprolol succinate, metFORMIN, freestyle, FreeStyle InsuLinx Test, and empagliflozin. ? ?Allergies as of 03/09/2022   ? ?   Reactions  ? Aspirin Swelling  ? Mouth swells  ? Catfish [fish Allergy] Swelling  ? Penicillins Swelling  ? Stomach pain and rash  ? ?  ? ?  ?Medication  List  ?  ? ?  ? Accurate as of Mar 09, 2022  5:46 PM. If you have any questions, ask your nurse or doctor.  ?  ?  ? ?  ? ?STOP taking these medications   ? ?ibuprofen 600 MG tablet ?Commonly known as: ADVIL ?Stopped by: Claretta Fraise, MD ?  ?mirtazapine 15 MG tablet ?Commonly known as: REMERON ?Stopped by: Claretta Fraise, MD ?  ?Myrbetriq 25 MG Tb24 tablet ?Generic drug: mirabegron ER ?Stopped by: Claretta Fraise, MD ?  ?oxybutynin 5 MG tablet ?Commonly known as: DITROPAN ?Stopped by: Claretta Fraise, MD ?  ?tamsulosin 0.4 MG Caps capsule ?Commonly known as: FLOMAX ?Stopped by: Claretta Fraise, MD ?  ? ?  ? ?TAKE these medications   ? ?apixaban 5 MG Tabs tablet ?Commonly known as: ELIQUIS ?Take by mouth. ?  ?atorvastatin 40 MG tablet ?Commonly known as: LIPITOR ?Take 1 tablet (40 mg total) by mouth daily. ?  ?empagliflozin 25 MG Tabs tablet ?Commonly known as: JARDIANCE ?Take 1 tablet by mouth daily. ?  ?FreeStyle InsuLinx Test test strip ?Generic drug: glucose blood ?  ?freestyle lancets ?  ?metFORMIN 500 MG tablet ?Commonly known as: GLUCOPHAGE ?Take by mouth. ?  ?metoprolol succinate 25 MG 24 hr tablet ?Commonly known as: TOPROL-XL ?Take 0.5 tablets by mouth daily. ?  ?pantoprazole 40 MG tablet ?Commonly known as: PROTONIX ?Take 40 mg by mouth 2 (two) times daily. ?  ? ?  ? ?Pt. Has appt. For DM teaching on 5/19. Encouraged to follow through. DM teaching regarding monitoring glucose, basic carb moderation performed ? ?Follow-up: Return in about 2 weeks (around 03/23/2022), or if symptoms worsen or fail to improve. ? ?Claretta Fraise, M.D. ?

## 2022-03-10 ENCOUNTER — Ambulatory Visit: Payer: Medicare HMO | Admitting: Nurse Practitioner

## 2022-03-10 ENCOUNTER — Other Ambulatory Visit: Payer: Self-pay | Admitting: *Deleted

## 2022-03-10 DIAGNOSIS — R748 Abnormal levels of other serum enzymes: Secondary | ICD-10-CM

## 2022-03-10 LAB — CMP14+EGFR
ALT: 53 IU/L — ABNORMAL HIGH (ref 0–44)
AST: 41 IU/L — ABNORMAL HIGH (ref 0–40)
Albumin/Globulin Ratio: 1.3 (ref 1.2–2.2)
Albumin: 4.3 g/dL (ref 3.7–4.7)
Alkaline Phosphatase: 55 IU/L (ref 44–121)
BUN/Creatinine Ratio: 9 — ABNORMAL LOW (ref 10–24)
BUN: 10 mg/dL (ref 8–27)
Bilirubin Total: 0.5 mg/dL (ref 0.0–1.2)
CO2: 21 mmol/L (ref 20–29)
Calcium: 10.1 mg/dL (ref 8.6–10.2)
Chloride: 104 mmol/L (ref 96–106)
Creatinine, Ser: 1.09 mg/dL (ref 0.76–1.27)
Globulin, Total: 3.2 g/dL (ref 1.5–4.5)
Glucose: 162 mg/dL — ABNORMAL HIGH (ref 70–99)
Potassium: 5 mmol/L (ref 3.5–5.2)
Sodium: 140 mmol/L (ref 134–144)
Total Protein: 7.5 g/dL (ref 6.0–8.5)
eGFR: 72 mL/min/{1.73_m2} (ref >59)

## 2022-03-10 NOTE — Progress Notes (Signed)
Patient calling back. Please return call.  

## 2022-03-13 ENCOUNTER — Encounter: Payer: Self-pay | Admitting: Nurse Practitioner

## 2022-03-13 ENCOUNTER — Ambulatory Visit (INDEPENDENT_AMBULATORY_CARE_PROVIDER_SITE_OTHER): Payer: Medicare Other | Admitting: Nurse Practitioner

## 2022-03-13 VITALS — BP 106/71 | HR 66 | Ht 70.5 in | Wt 204.0 lb

## 2022-03-13 DIAGNOSIS — E1165 Type 2 diabetes mellitus with hyperglycemia: Secondary | ICD-10-CM | POA: Diagnosis not present

## 2022-03-13 MED ORDER — POGO AUTOMATIC TEST CARTRIDGES VI TEST: 3 refills | Status: DC

## 2022-03-13 MED ORDER — POGO AUTOMATIC BLOOD GLUCOSE DEVI: 0 refills | Status: AC

## 2022-03-13 NOTE — Patient Instructions (Signed)

## 2022-03-13 NOTE — Progress Notes (Signed)
Endocrinology Consult Note       03/13/2022, 9:54 AM   Subjective:    Patient ID: Randy Benjamin, male    DOB: 1950/11/30.  Randy Benjamin is being seen in consultation for management of currently uncontrolled symptomatic diabetes requested by  Claretta Fraise, MD.   Past Medical History:  Diagnosis Date   Complication of anesthesia    slow to wake up, reports becoming combative under anesthesia and needs to be "tied down"   GERD (gastroesophageal reflux disease)    Hx of typhoid fever    Poor dentition     Past Surgical History:  Procedure Laterality Date   MULTIPLE EXTRACTIONS WITH ALVEOLOPLASTY  01/04/2012   Procedure: MULTIPLE EXTRACION WITH ALVEOLOPLASTY;  Surgeon: Gae Bon, DDS;  Location: [facility];  Service: Oral Surgery;  Laterality: Bilateral;  Multiple Extractions with Alveoplasty/Bilateral Removal of Tori   WISDOM TOOTH EXTRACTION      Social History   Socioeconomic History   Marital status: Divorced    Spouse name: Not on file   Number of children: Not on file   Years of education: Not on file   Highest education level: Not on file  Occupational History   Not on file  Tobacco Use   Smoking status: Every Day    Packs/day: 0.25    Types: Cigarettes   Smokeless tobacco: Never  Vaping Use   Vaping Use: Never used  Substance and Sexual Activity   Alcohol use: Yes    Comment: occasional    Drug use: Never   Sexual activity: Not on file  Other Topics Concern   Not on file  Social History Narrative   Not on file   Social Determinants of Health   Financial Resource Strain: Not on file  Food Insecurity: Not on file  Transportation Needs: Not on file  Physical Activity: Not on file  Stress: Not on file  Social Connections: Not on file    History reviewed. No pertinent family history.  Outpatient Encounter Medications as of 03/13/2022  Medication Sig   Blood Glucose Monitoring  Suppl (POGO AUTOMATIC BLOOD GLUCOSE) DEVI Use to monitor glucose twice daily   Glucose Blood Automatic (POGO AUTOMATIC TEST CARTRIDGES) TEST Use to monitor glucose twice daily.   apixaban (ELIQUIS) 5 MG TABS tablet Take by mouth.   atorvastatin (LIPITOR) 40 MG tablet Take 1 tablet (40 mg total) by mouth daily.   Lancets (FREESTYLE) lancets    metFORMIN (GLUCOPHAGE) 500 MG tablet Take by mouth.   metoprolol succinate (TOPROL-XL) 25 MG 24 hr tablet Take 0.5 tablets by mouth daily.   [DISCONTINUED] empagliflozin (JARDIANCE) 25 MG TABS tablet Take 1 tablet by mouth daily.   [DISCONTINUED] glucose blood (FREESTYLE INSULINX TEST) test strip    [DISCONTINUED] pantoprazole (PROTONIX) 40 MG tablet Take 40 mg by mouth 2 (two) times daily. (Patient not taking: Reported on 03/09/2022)   No facility-administered encounter medications on file as of 03/13/2022.    ALLERGIES: Allergies  Allergen Reactions   Aspirin Swelling    Mouth swells   Catfish [Fish Allergy] Swelling   Penicillins Swelling    Stomach pain and rash    VACCINATION STATUS: Immunization History  Administered Date(s) Administered   PFIZER(Purple Top)SARS-COV-2 Vaccination 09/04/2020, 09/25/2020    Diabetes He presents for his initial diabetic visit. He has type 2 diabetes mellitus. Onset time: recently diagnosed at age of 58. There are no hypoglycemic associated symptoms. Associated symptoms include blurred vision, fatigue, polydipsia, polyuria and weight loss. There are no hypoglycemic complications. Symptoms are stable. There are no diabetic complications. Risk factors for coronary artery disease include diabetes mellitus, dyslipidemia, family history and male sex. Current diabetic treatment includes oral agent (dual therapy). He is compliant with treatment most of the time. His weight is decreasing steadily. He is following a generally unhealthy diet. When asked about meal planning, he reported none. He has not had a previous visit  with a dietitian. He participates in exercise intermittently. (He presents today for his consultation with no meter or logs to review.  He was just recently diagnosed with diabetes and received a meter but did not have a lancet device and had trouble getting blood to perform them correctly.  He has trouble with his vision as well.  His most recent A1c on 02/28/22 was 11.9%.  He drinks mostly water with cream and orange juice and milk, very little water, eats 1 meal per day with snacks in between.  He stays active, says he is a runner and works out at his house.  He is due for eye exam (has floaters) and has seen podiatry in the past.) An ACE inhibitor/angiotensin II receptor blocker is not being taken. He does not see a podiatrist.Eye exam is not current.    Review of systems  Constitutional: + steadily decreasing body weight, current Body mass index is 28.86 kg/m., no fatigue, no subjective hyperthermia, no subjective hypothermia Eyes: + blurry vision, has floaters in both eyes, no xerophthalmia ENT: no sore throat, no nodules palpated in throat, no dysphagia/odynophagia, no hoarseness Cardiovascular: no chest pain, no shortness of breath, no palpitations, no leg swelling Respiratory: no cough, no shortness of breath Gastrointestinal: no nausea/vomiting/diarrhea Genitourinary: + polyuria with leakage Musculoskeletal: no muscle/joint aches Skin: + yeast in groin area, no hyperemia Neurological: no tremors, no numbness, no tingling, no dizziness Psychiatric: no depression, no anxiety  Objective:     BP 106/71   Pulse 66   Ht 5' 10.5" (1.791 m)   Wt 204 lb (92.5 kg)   BMI 28.86 kg/m   Wt Readings from Last 3 Encounters:  03/13/22 204 lb (92.5 kg)  03/09/22 202 lb 12.8 oz (92 kg)  08/05/20 209 lb 12.8 oz (95.2 kg)     BP Readings from Last 3 Encounters:  03/13/22 106/71  03/09/22 107/68  08/05/20 131/80     Physical Exam- Limited  Constitutional:  Body mass index is 28.86 kg/m. ,  not in acute distress, distracted state of mind with frequent interruptions Eyes:  EOMI, no exophthalmos Neck: Supple Respiratory: Adequate breathing efforts Musculoskeletal: no gross deformities, strength intact in all four extremities, no gross restriction of joint movements Skin:  no rashes, no hyperemia Neurological: no tremor with outstretched hands    CMP ( most recent) CMP     Component Value Date/Time   NA 140 03/09/2022 1307   K 5.0 03/09/2022 1307   CL 104 03/09/2022 1307   CO2 21 03/09/2022 1307   GLUCOSE 162 (H) 03/09/2022 1307   BUN 10 03/09/2022 1307   CREATININE 1.09 03/09/2022 1307   CALCIUM 10.1 03/09/2022 1307   PROT 7.5 03/09/2022 1307   ALBUMIN 4.3 03/09/2022 1307   AST  41 (H) 03/09/2022 1307   ALT 53 (H) 03/09/2022 1307   ALKPHOS 55 03/09/2022 1307   BILITOT 0.5 03/09/2022 1307   GFRNONAA 77 01/16/2020 1406   GFRAA 89 01/16/2020 1406     Diabetic Labs (most recent): Lab Results  Component Value Date   HGBA1C 11.9 02/28/2022     Lipid Panel ( most recent) Lipid Panel  No results found for: CHOL, TRIG, HDL, CHOLHDL, VLDL, LDLCALC, LDLDIRECT, LABVLDL    No results found for: TSH, FREET4         Assessment & Plan:   1) Type 2 diabetes mellitus with hyperglycemia, without long-term current use of insulin ([place])  He presents today for his consultation with no meter or logs to review.  He was just recently diagnosed with diabetes and received a meter but did not have a lancet device and had trouble getting blood to perform them correctly.  He has trouble with his vision as well.  His most recent A1c on 02/28/22 was 11.9%.  He drinks mostly water with cream and orange juice and milk, very little water, eats 1 meal per day with snacks in between.  He stays active, says he is a runner and works out at his house.  He is due for eye exam (has floaters) and has seen podiatry in the past.  - MICHAELRAY WESTERMANN has currently uncontrolled symptomatic type 2 DM  since 71 years of age (newly diagnosed), with most recent A1c of 11.9 %.   -Recent labs reviewed.  - I had a long discussion with him about the progressive nature of diabetes and the pathology behind its complications. -his diabetes is not currently complicated but he remains at a high risk for more acute and chronic complications which include CAD, CVA, CKD, retinopathy, and neuropathy. These are all discussed in detail with him.  The following Lifestyle Medicine recommendations according to [facility]) were discussed and offered to patient and he agrees to start the journey:  A. Whole Foods, Plant-based plate comprising of fruits and vegetables, plant-based proteins, whole-grain carbohydrates was discussed in detail with the patient.   A list for source of those nutrients were also provided to the patient.  Patient will use only water or unsweetened tea for hydration. B.  The need to stay away from risky substances including alcohol, smoking; obtaining 7 to 9 hours of restorative sleep, at least 150 minutes of moderate intensity exercise weekly, the importance of healthy social connections,  and stress reduction techniques were discussed. C.  A full color page of  Calorie density of various food groups per pound showing examples of each food groups was provided to the patient.  - I have counseled him on diet and weight management by adopting a carbohydrate restricted/protein rich diet. Patient is encouraged to switch to unprocessed or minimally processed complex starch and increased protein intake (animal or plant source), fruits, and vegetables. -  he is advised to stick to a routine mealtimes to eat 3 meals a day and avoid unnecessary snacks (to snack only to correct hypoglycemia).   - he acknowledges that there is a room for improvement in his food and drink choices. - Suggestion is made for him to avoid simple carbohydrates from his diet including Cakes, Sweet  Desserts, Ice Cream, Soda (diet and regular), Sweet Tea, Candies, Chips, Cookies, Store Bought Juices, Alcohol in Excess of 1-2 drinks a day, Artificial Sweeteners, Coffee Creamer, and "Sugar-free" Products. This will help patient to  have more stable blood glucose profile and potentially avoid unintended weight gain.  - he will be scheduled with Jearld Fenton, RDN, CDE for diabetes education.  - I have approached him with the following individualized plan to manage his diabetes and patient agrees:   -He is advised to continue Metformin 500 mg po twice daily with meals.   -he is encouraged to start monitoring glucose 4 times daily, before meals and before bed, to log their readings on the clinic sheets provided, and bring them to review at follow up appointment in 2 weeks.  I sent in for POGO meter for him as I believe he needs this due to his visual impairment.  - Adjustment parameters are given to him for hypo and hyperglycemia in writing. - he is encouraged to call clinic for blood glucose levels less than 70 or above 300 mg /dl.  - his Vania Rea will be discontinued, risk outweighs benefit for this patient- hx polyuria and yeast in groin.  - he will be considered for incretin therapy as appropriate next visit.  - Specific targets for  A1c; LDL, HDL, and Triglycerides were discussed with the patient.  2) Blood Pressure /Hypertension:  his blood pressure is controlled to target.   he is advised to continue his current medications including Metoprolol 25 mg po daily with breakfast.  3) Lipids/Hyperlipidemia:    There is no recent lipid panel to review. he is advised to continue Lipitor 40 mg daily at bedtime.  Side effects and precautions discussed with him.  4)  Weight/Diet:  his Body mass index is 28.86 kg/m.  -  clearly complicating his diabetes care.   he is a candidate for some weight loss. I discussed with him the fact that loss of 5 - 10% of his  current body weight will have the  most impact on his diabetes management.  Exercise, and detailed carbohydrates information provided  -  detailed on discharge instructions.  5) Chronic Care/Health Maintenance: -he is not on ACEI/ARB and is on Statin medications and is encouraged to initiate and continue to follow up with Ophthalmology, Dentist, Podiatrist at least yearly or according to recommendations, and advised to stay away from smoking (recently quit during recent hospitalization). I have recommended yearly flu vaccine and pneumonia vaccine at least every 5 years; moderate intensity exercise for up to 150 minutes weekly; and sleep for at least 7 hours a day.  - he is advised to maintain close follow up with Claretta Fraise, MD for primary care needs, as well as his other providers for optimal and coordinated care.   - Time spent in this patient care: 60 min, of which > 50% was spent in counseling him about his diabetes and the rest reviewing his blood glucose logs, discussing his hypoglycemia and hyperglycemia episodes, reviewing his current and previous labs/studies (including abstraction from other facilities) and medications doses and developing a long term treatment plan based on the latest standards of care/guidelines; and documenting his care.    Please refer to Patient Instructions for Blood Glucose Monitoring and Insulin/Medications Dosing Guide" in media tab for additional information. Please also refer to "Patient Self Inventory" in the Media tab for reviewed elements of pertinent patient history.  Randy Benjamin participated in the discussions, expressed understanding, and voiced agreement with the above plans.  All questions were answered to his satisfaction. he is encouraged to contact clinic should he have any questions or concerns prior to his return visit.     Follow  up plan: - Return in about 2 weeks (around 03/27/2022) for Diabetes F/U, Bring meter and logs.    Rayetta Pigg, [facility]  [facility] [address] [place],  [address] Phone: [phone] Fax: [phone]  03/13/2022, 9:54 AM

## 2022-03-18 ENCOUNTER — Telehealth: Payer: Self-pay | Admitting: Nurse Practitioner

## 2022-03-18 NOTE — Telephone Encounter (Signed)
Pt called and said his insurance does not cover the Pogo device that was called in and is supposed to be sending over something that they will cover for [place] to send in.

## 2022-03-18 NOTE — Telephone Encounter (Signed)
I figured they would kick back on it since it is so specialized.  Will wait and see what is covered.

## 2022-04-02 DIAGNOSIS — I480 Paroxysmal atrial fibrillation: Secondary | ICD-10-CM | POA: Diagnosis not present

## 2022-04-02 DIAGNOSIS — E785 Hyperlipidemia, unspecified: Secondary | ICD-10-CM | POA: Diagnosis not present

## 2022-04-02 DIAGNOSIS — E1169 Type 2 diabetes mellitus with other specified complication: Secondary | ICD-10-CM | POA: Diagnosis not present

## 2022-04-07 ENCOUNTER — Ambulatory Visit: Payer: Medicare Other | Admitting: Nurse Practitioner

## 2022-04-07 NOTE — Patient Instructions (Incomplete)
Diabetes Mellitus and Foot Care Foot care is an important part of your health, especially when you have diabetes. Diabetes may cause you to have problems because of poor blood flow (circulation) to your feet and legs, which can cause your skin to: Become thinner and drier. Break more easily. Heal more slowly. Peel and crack. You may also have nerve damage (neuropathy) in your legs and feet, causing decreased feeling in them. This means that you may not notice minor injuries to your feet that could lead to more serious problems. Noticing and addressing any potential problems early is the best way to prevent future foot problems. How to care for your feet Foot hygiene  Wash your feet daily with warm water and mild soap. Do not use hot water. Then, pat your feet and the areas between your toes until they are completely dry. Do not soak your feet as this can dry your skin. Trim your toenails straight across. Do not dig under them or around the cuticle. File the edges of your nails with an emery board or nail file. Apply a moisturizing lotion or petroleum jelly to the skin on your feet and to dry, brittle toenails. Use lotion that does not contain alcohol and is unscented. Do not apply lotion between your toes. Shoes and socks Wear clean socks or stockings every day. Make sure they are not too tight. Do not wear knee-high stockings since they may decrease blood flow to your legs. Wear shoes that fit properly and have enough cushioning. Always look in your shoes before you put them on to be sure there are no objects inside. To break in new shoes, wear them for just a few hours a day. This prevents injuries on your feet. Wounds, scrapes, corns, and calluses  Check your feet daily for blisters, cuts, bruises, sores, and redness. If you cannot see the bottom of your feet, use a mirror or ask someone for help. Do not cut corns or calluses or try to remove them with medicine. If you find a minor scrape,  cut, or break in the skin on your feet, keep it and the skin around it clean and dry. You may clean these areas with mild soap and water. Do not clean the area with peroxide, alcohol, or iodine. If you have a wound, scrape, corn, or callus on your foot, look at it several times a day to make sure it is healing and not infected. Check for: Redness, swelling, or pain. Fluid or blood. Warmth. Pus or a bad smell. General tips Do not cross your legs. This may decrease blood flow to your feet. Do not use heating pads or hot water bottles on your feet. They may burn your skin. If you have lost feeling in your feet or legs, you may not know this is happening until it is too late. Protect your feet from hot and cold by wearing shoes, such as at the beach or on hot pavement. Schedule a complete foot exam at least once a year (annually) or more often if you have foot problems. Report any cuts, sores, or bruises to your health care provider immediately. Where to find more information American Diabetes Association: www.diabetes.org Association of Diabetes Care & Education Specialists: www.diabeteseducator.org Contact a health care provider if: You have a medical condition that increases your risk of infection and you have any cuts, sores, or bruises on your feet. You have an injury that is not healing. You have redness on your legs or feet. You   feel burning or tingling in your legs or feet. You have pain or cramps in your legs and feet. Your legs or feet are numb. Your feet always feel cold. You have pain around any toenails. Get help right away if: You have a wound, scrape, corn, or callus on your foot and: You have pain, swelling, or redness that gets worse. You have fluid or blood coming from the wound, scrape, corn, or callus. Your wound, scrape, corn, or callus feels warm to the touch. You have pus or a bad smell coming from the wound, scrape, corn, or callus. You have a fever. You have a red  line going up your leg. Summary Check your feet every day for blisters, cuts, bruises, sores, and redness. Apply a moisturizing lotion or petroleum jelly to the skin on your feet and to dry, brittle toenails. Wear shoes that fit properly and have enough cushioning. If you have foot problems, report any cuts, sores, or bruises to your health care provider immediately. Schedule a complete foot exam at least once a year (annually) or more often if you have foot problems. This information is not intended to replace advice given to you by your health care provider. Make sure you discuss any questions you have with your health care provider. Document Revised: 05/02/2020 Document Reviewed: 05/02/2020 Elsevier Patient Education  2023 [employer]  

## 2022-04-21 NOTE — Patient Instructions (Signed)

## 2022-04-22 ENCOUNTER — Telehealth: Payer: Self-pay | Admitting: Nurse Practitioner

## 2022-04-22 ENCOUNTER — Ambulatory Visit (INDEPENDENT_AMBULATORY_CARE_PROVIDER_SITE_OTHER): Payer: Medicare Other | Admitting: Nurse Practitioner

## 2022-04-22 ENCOUNTER — Encounter: Payer: Self-pay | Admitting: Nurse Practitioner

## 2022-04-22 VITALS — BP 110/70 | HR 69 | Ht 73.0 in | Wt 197.0 lb

## 2022-04-22 DIAGNOSIS — E1165 Type 2 diabetes mellitus with hyperglycemia: Secondary | ICD-10-CM | POA: Diagnosis not present

## 2022-04-22 MED ORDER — POGO AUTOMATIC TEST CARTRIDGES VI TEST: 3 refills | Status: AC

## 2022-04-22 NOTE — Progress Notes (Signed)
Endocrinology Follow Up Note       04/22/2022, 3:55 PM   Subjective:    Patient ID: Randy Benjamin, male    DOB: 06-11-1951.  Randy Benjamin is being seen in follow up after being seen in consultation for management of currently uncontrolled symptomatic diabetes requested by  Mechele Claude, MD.   Past Medical History:  Diagnosis Date   Complication of anesthesia    slow to wake up, reports becoming combative under anesthesia and needs to be "tied down"   GERD (gastroesophageal reflux disease)    Hx of typhoid fever    Poor dentition     Past Surgical History:  Procedure Laterality Date   MULTIPLE EXTRACTIONS WITH ALVEOLOPLASTY  01/04/2012   Procedure: MULTIPLE EXTRACION WITH ALVEOLOPLASTY;  Surgeon: [name], DDS;  Location: [facility] OR;  Service: Oral Surgery;  Laterality: Bilateral;  Multiple Extractions with Alveoplasty/Bilateral Removal of Tori   WISDOM TOOTH EXTRACTION      Social History   Socioeconomic History   Marital status: Divorced    Spouse name: Not on file   Number of children: Not on file   Years of education: Not on file   Highest education level: Not on file  Occupational History   Not on file  Tobacco Use   Smoking status: Every Day    Packs/day: 0.25    Types: Cigarettes   Smokeless tobacco: Never  Vaping Use   Vaping Use: Never used  Substance and Sexual Activity   Alcohol use: Yes    Comment: occasional    Drug use: Never   Sexual activity: Not on file  Other Topics Concern   Not on file  Social History Narrative   Not on file   Social Determinants of Health   Financial Resource Strain: Not on file  Food Insecurity: Not on file  Transportation Needs: Not on file  Physical Activity: Not on file  Stress: Not on file  Social Connections: Not on file    History reviewed. No pertinent family history.  Outpatient Encounter Medications as of 04/22/2022  Medication  Sig   apixaban (ELIQUIS) 5 MG TABS tablet Take by mouth.   atorvastatin (LIPITOR) 40 MG tablet Take 1 tablet (40 mg total) by mouth daily.   Blood Glucose Monitoring Suppl (POGO AUTOMATIC BLOOD GLUCOSE) DEVI Use to monitor glucose twice daily   Glucose Blood Automatic (POGO AUTOMATIC TEST CARTRIDGES) TEST Use to monitor glucose twice daily.   Lancets (FREESTYLE) lancets    metoprolol succinate (TOPROL-XL) 25 MG 24 hr tablet Take 0.5 tablets by mouth daily.   [DISCONTINUED] Glucose Blood Automatic (POGO AUTOMATIC TEST CARTRIDGES) TEST Use to monitor glucose twice daily.   No facility-administered encounter medications on file as of 04/22/2022.    ALLERGIES: Allergies  Allergen Reactions   Aspirin Swelling    Mouth swells   Catfish [Fish Allergy] Swelling   Penicillins Swelling    Stomach pain and rash   Zinc     VACCINATION STATUS: Immunization History  Administered Date(s) Administered   PFIZER(Purple Top)SARS-COV-2 Vaccination 09/04/2020, 09/25/2020    Diabetes He presents for his follow-up diabetic visit. He has type 2 diabetes mellitus. Onset time: recently  diagnosed at age of 71. His disease course has been improving. There are no hypoglycemic associated symptoms. Associated symptoms include blurred vision, fatigue and weight loss. Pertinent negatives for diabetes include no polydipsia and no polyuria. There are no hypoglycemic complications. Symptoms are improving. There are no diabetic complications. Risk factors for coronary artery disease include diabetes mellitus, dyslipidemia, family history and male sex. Current diabetic treatment includes diet and oral agent (monotherapy). His weight is decreasing steadily. He is following a generally unhealthy diet. When asked about meal planning, he reported none. He has not had a previous visit with a dietitian. He participates in exercise intermittently. His home blood glucose trend is decreasing steadily. His breakfast blood glucose  range is generally 90-110 mg/dl. His lunch blood glucose range is generally 90-110 mg/dl. His dinner blood glucose range is generally 90-110 mg/dl. His bedtime blood glucose range is generally 90-110 mg/dl. (He presents today with his meter and logs showing at target glycemic profile overall.  He was not due for another A1c today.  He did note one episode of hypoglycemia related to his meal timing being off.  ) An ACE inhibitor/angiotensin II receptor blocker is not being taken. He does not see a podiatrist.Eye exam is not current.   He was very sexually inappropriate during today's visit, constantly referencing his penis and saying that "he could see right through my clothes" and other obscenities.  He made the women in the clinic feel very uncomfortable, and due to this will be asked not to return to our clinic moving forward.  Review of systems  Constitutional: + steadily decreasing body weight, current Body mass index is 25.99 kg/m., no fatigue, no subjective hyperthermia, no subjective hypothermia Eyes: + blurry vision, has floaters in both eyes, no xerophthalmia ENT: no sore throat, no nodules palpated in throat, no dysphagia/odynophagia, no hoarseness Cardiovascular: no chest pain, no shortness of breath, no palpitations, no leg swelling Respiratory: no cough, no shortness of breath Gastrointestinal: no nausea/vomiting/diarrhea Genitourinary: + polyuria with leakage Musculoskeletal: no muscle/joint aches Skin: + yeast in groin area- improved off SGLT2i, no hyperemia Neurological: no tremors, no numbness, no tingling, no dizziness Psychiatric: no depression, no anxiety  Objective:     BP 110/70   Pulse 69   Ht 6\' 1"  (1.854 m)   Wt 197 lb (89.4 kg)   BMI 25.99 kg/m   Wt Readings from Last 3 Encounters:  04/22/22 197 lb (89.4 kg)  03/13/22 204 lb (92.5 kg)  03/09/22 202 lb 12.8 oz (92 kg)     BP Readings from Last 3 Encounters:  04/22/22 110/70  03/13/22 106/71  03/09/22  107/68     Physical Exam- Limited  Constitutional:  Body mass index is 25.99 kg/m. , not in acute distress, distracted state of mind with frequent interruptions and bringing up sexual references multiple times Eyes:  EOMI, no exophthalmos Neck: Supple Respiratory: Adequate breathing efforts Musculoskeletal: no gross deformities, strength intact in all four extremities, no gross restriction of joint movements Skin:  no rashes, no hyperemia Neurological: no tremor with outstretched hands    CMP ( most recent) CMP     Component Value Date/Time   NA 140 03/09/2022 1307   K 5.0 03/09/2022 1307   CL 104 03/09/2022 1307   CO2 21 03/09/2022 1307   GLUCOSE 162 (H) 03/09/2022 1307   BUN 10 03/09/2022 1307   CREATININE 1.09 03/09/2022 1307   CALCIUM 10.1 03/09/2022 1307   PROT 7.5 03/09/2022 1307   ALBUMIN 4.3  03/09/2022 1307   AST 41 (H) 03/09/2022 1307   ALT 53 (H) 03/09/2022 1307   ALKPHOS 55 03/09/2022 1307   BILITOT 0.5 03/09/2022 1307   GFRNONAA 77 01/16/2020 1406   GFRAA 89 01/16/2020 1406     Diabetic Labs (most recent): Lab Results  Component Value Date   HGBA1C 11.9 02/28/2022     Lipid Panel ( most recent) Lipid Panel  No results found for: "CHOL", "TRIG", "HDL", "CHOLHDL", "VLDL", "LDLCALC", "LDLDIRECT", "LABVLDL"    No results found for: "TSH", "FREET4"         Assessment & Plan:   1) Type 2 diabetes mellitus with hyperglycemia, without long-term current use of insulin (HCC)  He presents today with his meter and logs showing at target glycemic profile overall.  He was not due for another A1c today.  He did note one episode of hypoglycemia related to his meal timing being off.    - Randy Benjamin has currently uncontrolled symptomatic type 2 DM since 71 years of age (newly diagnosed), with most recent A1c of 11.9 %.   -Recent labs reviewed.  - I had a long discussion with him about the progressive nature of diabetes and the pathology behind its  complications. -his diabetes is not currently complicated but he remains at a high risk for more acute and chronic complications which include CAD, CVA, CKD, retinopathy, and neuropathy. These are all discussed in detail with him.  The following Lifestyle Medicine recommendations according to American College of Lifestyle Medicine [facility]) were discussed and offered to patient and he agrees to start the journey:  A. Whole Foods, Plant-based plate comprising of fruits and vegetables, plant-based proteins, whole-grain carbohydrates was discussed in detail with the patient.   A list for source of those nutrients were also provided to the patient.  Patient will use only water or unsweetened tea for hydration. B.  The need to stay away from risky substances including alcohol, smoking; obtaining 7 to 9 hours of restorative sleep, at least 150 minutes of moderate intensity exercise weekly, the importance of healthy social connections,  and stress reduction techniques were discussed. C.  A full color page of  Calorie density of various food groups per pound showing examples of each food groups was provided to the patient.  - Nutritional counseling repeated at each appointment due to patients tendency to fall back in to old habits.  - The patient admits there is a room for improvement in their diet and drink choices. -  Suggestion is made for the patient to avoid simple carbohydrates from their diet including Cakes, Sweet Desserts / Pastries, Ice Cream, Soda (diet and regular), Sweet Tea, Candies, Chips, Cookies, Sweet Pastries, Store Bought Juices, Alcohol in Excess of 1-2 drinks a day, Artificial Sweeteners, Coffee Creamer, and "Sugar-free" Products. This will help patient to have stable blood glucose profile and potentially avoid unintended weight gain.   - I encouraged the patient to switch to unprocessed or minimally processed complex starch and increased protein intake (animal or plant source), fruits, and  vegetables.   - Patient is advised to stick to a routine mealtimes to eat 3 meals a day and avoid unnecessary snacks (to snack only to correct hypoglycemia).  - he will be scheduled with Norm Salt, RDN, CDE for diabetes education.  - I have approached him with the following individualized plan to manage his diabetes and patient agrees:   -Given his improved glycemic profile, he will be kept off all medications.  -  he is encouraged to continue monitoring glucose at least once daily, before breakfast.  - Adjustment parameters are given to him for hypo and hyperglycemia in writing.  - his [place] Pepper was discontinued, risk outweighs benefit for this patient- hx polyuria and yeast in groin.  - he will be considered for incretin therapy as appropriate next visit.  - Specific targets for  A1c; LDL, HDL, and Triglycerides were discussed with the patient.  2) Blood Pressure /Hypertension:  his blood pressure is controlled to target.   he is advised to continue his current medications including Metoprolol 25 mg po daily with breakfast.  3) Lipids/Hyperlipidemia:    There is no recent lipid panel to review. he is advised to continue Lipitor 40 mg daily at bedtime.  Side effects and precautions discussed with him.  4)  Weight/Diet:  his Body mass index is 25.99 kg/m.  -  clearly complicating his diabetes care.   he is a candidate for some weight loss. I discussed with him the fact that loss of 5 - 10% of his  current body weight will have the most impact on his diabetes management.  Exercise, and detailed carbohydrates information provided  -  detailed on discharge instructions.  5) Chronic Care/Health Maintenance: -he is not on ACEI/ARB and is on Statin medications and is encouraged to initiate and continue to follow up with Ophthalmology, Dentist, Podiatrist at least yearly or according to recommendations, and advised to stay away from smoking (recently quit during recent hospitalization). I  have recommended yearly flu vaccine and pneumonia vaccine at least every 5 years; moderate intensity exercise for up to 150 minutes weekly; and sleep for at least 7 hours a day.   - he is advised to maintain close follow up with Mechele Claude, MD for primary care needs, as well as his other providers for optimal and coordinated care.       I spent 28 minutes in the care of the patient today including review of labs from CMP, Lipids, Thyroid Function, Hematology (current and previous including abstractions from other facilities); face-to-face time discussing  his blood glucose readings/logs, discussing hypoglycemia and hyperglycemia episodes and symptoms, medications doses, his options of short and long term treatment based on the latest standards of care / guidelines;  discussion about incorporating lifestyle medicine;  and documenting the encounter. Risk reduction counseling performed per USPSTF guidelines to reduce obesity and cardiovascular risk factors.     Please refer to Patient Instructions for Blood Glucose Monitoring and Insulin/Medications Dosing Guide"  in media tab for additional information. Please  also refer to " Patient Self Inventory" in the Media  tab for reviewed elements of pertinent patient history.  Randy Benjamin participated in the discussions, expressed understanding, and voiced agreement with the above plans.  All questions were answered to his satisfaction. he is encouraged to contact clinic should he have any questions or concerns prior to his return visit.     Follow up plan:   He was very sexually inappropriate during today's visit, constantly referencing his penis and saying that "he could see right through my clothes" and other obscenities.  He made the women in the clinic feel very uncomfortable, and due to this will be asked not to return to our clinic moving forward.  He will need to refer back to his PCP regarding his diabetes care.    Ronny Bacon,  [facility] [address] [place], [place] [address] Phone: [phone] Fax: [phone]  04/22/2022, 3:55  PM

## 2022-04-22 NOTE — Telephone Encounter (Signed)
Per Alphonzo Lemmings FNP and Dr. Fransico Him patient has been dismissed from this office for making multiple inappropriate comments. A dismissal letter has been placed in the mail today 04/22/22.

## 2022-07-23 ENCOUNTER — Ambulatory Visit: Payer: Medicare Other | Admitting: "Endocrinology

## 2022-10-28 DIAGNOSIS — E785 Hyperlipidemia, unspecified: Secondary | ICD-10-CM | POA: Diagnosis not present

## 2022-10-28 DIAGNOSIS — I480 Paroxysmal atrial fibrillation: Secondary | ICD-10-CM | POA: Diagnosis not present

## 2022-10-28 DIAGNOSIS — E1169 Type 2 diabetes mellitus with other specified complication: Secondary | ICD-10-CM | POA: Diagnosis not present

## 2022-12-09 DIAGNOSIS — F17290 Nicotine dependence, other tobacco product, uncomplicated: Secondary | ICD-10-CM | POA: Diagnosis not present

## 2022-12-09 DIAGNOSIS — R319 Hematuria, unspecified: Secondary | ICD-10-CM | POA: Diagnosis not present

## 2022-12-09 DIAGNOSIS — F17210 Nicotine dependence, cigarettes, uncomplicated: Secondary | ICD-10-CM | POA: Diagnosis not present

## 2022-12-09 DIAGNOSIS — N471 Phimosis: Secondary | ICD-10-CM | POA: Diagnosis not present

## 2022-12-09 DIAGNOSIS — E119 Type 2 diabetes mellitus without complications: Secondary | ICD-10-CM | POA: Diagnosis not present

## 2022-12-09 DIAGNOSIS — I480 Paroxysmal atrial fibrillation: Secondary | ICD-10-CM | POA: Diagnosis not present

## 2022-12-15 DIAGNOSIS — H25813 Combined forms of age-related cataract, bilateral: Secondary | ICD-10-CM | POA: Diagnosis not present

## 2022-12-15 DIAGNOSIS — E119 Type 2 diabetes mellitus without complications: Secondary | ICD-10-CM | POA: Diagnosis not present

## 2023-03-26 NOTE — H&P (Signed)
Surgical History & Physical  Patient Name: Randy Benjamin DOB: 04/02/1951  Surgery: Cataract extraction with intraocular lens implant phacoemulsification; Left Eye  Surgeon: [name] MD Surgery Date:  04-02-23 Pre-Op Date:  03-18-23  HPI: A 49 Yr. old male patient present for cataract eval per Dr. Daphine Deutscher. 1. 1. The patient complains of difficulty when viewing TV, reading closed caption, news scrolls on TV, seeing road signs, and filling out forms which began 1 year ago. Both eyes are affected. The episode is constant. The condition's severity is worsening. This is negatively affecting the patient's quality of life and the patient is unable to function adequately in life with the current level of vision. HPI was performed by [name] .  Medical History: Cataracts  Review of Systems Negative Allergic/Immunologic Negative Cardiovascular Negative Constitutional Negative Ear, Nose, Mouth & Throat Negative Endocrine Negative Eyes Negative Gastrointestinal Negative Genitourinary Negative Hemotologic/Lymphatic Negative Integumentary Negative Musculoskeletal Negative Neurological Negative Psychiatry Negative Respiratory  Social   Current every day smoker /  Cigarettes   Medication Blood thinner (unsure why he is on this),   Sx/Procedures Oral Surgery,   Drug Allergies  Penicillin,   History & Physical: Heent: cataract, left eye NECK: supple without bruits LUNGS: lungs clear to auscultation CV: regular rate and rhythm Abdomen: soft and non-tender Impression & Plan: Assessment: 1.  COMBINED FORMS AGE RELATED CATARACT; Both Eyes (H25.813) 2.  BLEPHARITIS; Right Upper Lid, Right Lower Lid, Left Upper Lid, Left Lower Lid (H01.001, H01.002,H01.004,H01.005) 3.  DERMATOCHALASIS, no surgery; Right Upper Lid, Left Upper Lid (H02.831, H02.834) 4.  Pinguecula; Both Eyes (H11.153)  Plan: 1.  Cataract accounts for the patient's decreased vision. This visual impairment is  not correctable with a tolerable change in glasses or contact lenses. Cataract surgery with an implantation of a new lens should significantly improve the visual and functional status of the patient. Discussed all risks, benefits, alternatives, and potential complications. Discussed the procedures and recovery. Patient desires to have surgery. A-scan ordered and performed today for intra-ocular lens calculations. The surgery will be performed in order to improve vision for driving, reading, and for eye examinations. Recommend phacoemulsification with intra-ocular lens. Recommend Dextenza for post-operative pain and inflammation. Left Eye worse - first. Dilates poorly - shugarcaine by protocol. Malyugin Ring. Omidira.  2.  Recommend regular lid cleaning. Warm compresses 7-10 minutes every day, both eyes.  3.  Asymptomatic, recommend observation for now. Findings, prognosis and treatment options reviewed.  4.  Observe; Artificial tears as needed for irritation.

## 2023-03-29 DIAGNOSIS — H25812 Combined forms of age-related cataract, left eye: Secondary | ICD-10-CM | POA: Diagnosis not present

## 2023-03-30 ENCOUNTER — Encounter (HOSPITAL_COMMUNITY)
Admission: RE | Admit: 2023-03-30 | Discharge: 2023-03-30 | Disposition: A | Discharge status: Home / Self Care | Payer: 59 | Source: Ambulatory Visit | Attending: Ophthalmology | Admitting: Ophthalmology

## 2023-04-01 ENCOUNTER — Encounter (HOSPITAL_COMMUNITY): Payer: Self-pay

## 2023-04-01 ENCOUNTER — Other Ambulatory Visit: Payer: Self-pay

## 2023-04-01 NOTE — Pre-Procedure Instructions (Signed)
Attempted pre-op phone call 6/5 amd again this morning, 04/01/2023. There is no VM. Notified Zella Richer at [facility].

## 2023-04-02 ENCOUNTER — Ambulatory Visit (HOSPITAL_COMMUNITY)
Admission: RE | Admit: 2023-04-02 | Discharge: 2023-04-02 | Disposition: A | Discharge status: Home / Self Care | Payer: 59 | Attending: Ophthalmology | Admitting: Ophthalmology

## 2023-04-02 ENCOUNTER — Ambulatory Visit (HOSPITAL_BASED_OUTPATIENT_CLINIC_OR_DEPARTMENT_OTHER): Payer: 59 | Admitting: Certified Registered Nurse Anesthetist

## 2023-04-02 ENCOUNTER — Encounter (HOSPITAL_COMMUNITY)
Admission: RE | Disposition: A | Discharge status: Home / Self Care | Payer: Self-pay | Source: Home / Self Care | Attending: Ophthalmology

## 2023-04-02 ENCOUNTER — Other Ambulatory Visit: Payer: Self-pay

## 2023-04-02 ENCOUNTER — Ambulatory Visit (HOSPITAL_COMMUNITY): Payer: 59 | Admitting: Certified Registered Nurse Anesthetist

## 2023-04-02 DIAGNOSIS — J449 Chronic obstructive pulmonary disease, unspecified: Secondary | ICD-10-CM | POA: Insufficient documentation

## 2023-04-02 DIAGNOSIS — H25812 Combined forms of age-related cataract, left eye: Secondary | ICD-10-CM | POA: Diagnosis not present

## 2023-04-02 DIAGNOSIS — K219 Gastro-esophageal reflux disease without esophagitis: Secondary | ICD-10-CM | POA: Insufficient documentation

## 2023-04-02 DIAGNOSIS — E1136 Type 2 diabetes mellitus with diabetic cataract: Secondary | ICD-10-CM | POA: Diagnosis not present

## 2023-04-02 DIAGNOSIS — E119 Type 2 diabetes mellitus without complications: Secondary | ICD-10-CM

## 2023-04-02 DIAGNOSIS — F17210 Nicotine dependence, cigarettes, uncomplicated: Secondary | ICD-10-CM

## 2023-04-02 DIAGNOSIS — J45909 Unspecified asthma, uncomplicated: Secondary | ICD-10-CM | POA: Diagnosis not present

## 2023-04-02 HISTORY — PX: CATARACT EXTRACTION W/PHACO: SHX586

## 2023-04-02 LAB — GLUCOSE, CAPILLARY: Glucose-Capillary: 89 mg/dL (ref 70–99)

## 2023-04-02 SURGERY — PHACOEMULSIFICATION, CATARACT, WITH IOL INSERTION
Anesthesia: Monitor Anesthesia Care | Site: Eye | Laterality: Left

## 2023-04-02 MED ORDER — MIDAZOLAM HCL 5 MG/5ML IJ SOLN
INTRAMUSCULAR | Status: DC | PRN
  Administered 2023-04-02: 1 mg via INTRAVENOUS

## 2023-04-02 MED ORDER — TROPICAMIDE 1 % OP SOLN
1.0000 [drp] | OPHTHALMIC | Status: AC | PRN
  Administered 2023-04-02 (×3): 1 [drp] via OPHTHALMIC

## 2023-04-02 MED ORDER — MIDAZOLAM HCL 2 MG/2ML IJ SOLN
INTRAMUSCULAR | Status: AC
  Filled 2023-04-02: qty 2

## 2023-04-02 MED ORDER — ONDANSETRON HCL 4 MG/2ML IJ SOLN
INTRAMUSCULAR | Status: DC | PRN
  Administered 2023-04-02: 4 mg via INTRAVENOUS

## 2023-04-02 MED ORDER — SODIUM HYALURONATE 10 MG/ML IO SOLUTION
PREFILLED_SYRINGE | INTRAOCULAR | Status: DC | PRN
  Administered 2023-04-02: .85 mL via INTRAOCULAR

## 2023-04-02 MED ORDER — STERILE WATER FOR IRRIGATION IR SOLN
Status: DC | PRN
  Administered 2023-04-02: 250 mL

## 2023-04-02 MED ORDER — LIDOCAINE HCL 3.5 % OP GEL
1.0000 | Freq: Once | OPHTHALMIC | Status: AC
  Administered 2023-04-02: 1 via OPHTHALMIC

## 2023-04-02 MED ORDER — POVIDONE-IODINE 5 % OP SOLN
OPHTHALMIC | Status: DC | PRN
  Administered 2023-04-02: 1 via OPHTHALMIC

## 2023-04-02 MED ORDER — TETRACAINE HCL 0.5 % OP SOLN
1.0000 [drp] | OPHTHALMIC | Status: AC | PRN
  Administered 2023-04-02 (×3): 1 [drp] via OPHTHALMIC

## 2023-04-02 MED ORDER — LIDOCAINE HCL (PF) 1 % IJ SOLN
INTRAOCULAR | Status: DC | PRN
  Administered 2023-04-02: 1 mL via OPHTHALMIC

## 2023-04-02 MED ORDER — EPINEPHRINE PF 1 MG/ML IJ SOLN
INTRAMUSCULAR | Status: AC
  Filled 2023-04-02: qty 1

## 2023-04-02 MED ORDER — BSS IO SOLN
INTRAOCULAR | Status: DC | PRN
  Administered 2023-04-02: 15 mL via INTRAOCULAR

## 2023-04-02 MED ORDER — NEOMYCIN-POLYMYXIN-DEXAMETH 3.5-10000-0.1 OP SUSP
OPHTHALMIC | Status: DC | PRN
  Administered 2023-04-02: 2 [drp] via OPHTHALMIC

## 2023-04-02 MED ORDER — SODIUM HYALURONATE 23MG/ML IO SOSY
PREFILLED_SYRINGE | INTRAOCULAR | Status: DC | PRN
  Administered 2023-04-02: .6 mL via INTRAOCULAR

## 2023-04-02 MED ORDER — PHENYLEPHRINE HCL 2.5 % OP SOLN
1.0000 [drp] | OPHTHALMIC | Status: AC | PRN
  Administered 2023-04-02 (×3): 1 [drp] via OPHTHALMIC

## 2023-04-02 MED ORDER — PHENYLEPHRINE-KETOROLAC 1-0.3 % IO SOLN
INTRAOCULAR | Status: AC
  Filled 2023-04-02: qty 4

## 2023-04-02 MED ORDER — PHENYLEPHRINE-KETOROLAC 1-0.3 % IO SOLN
INTRAOCULAR | Status: DC | PRN
  Administered 2023-04-02: 500 mL via OPHTHALMIC

## 2023-04-02 SURGICAL SUPPLY — 13 items
CATARACT SUITE SIGHTPATH (MISCELLANEOUS) ×1 IMPLANT
CLOTH BEACON ORANGE TIMEOUT ST (SAFETY) ×1 IMPLANT
EYE SHIELD UNIVERSAL CLEAR (GAUZE/BANDAGES/DRESSINGS) IMPLANT
FEE CATARACT SUITE SIGHTPATH (MISCELLANEOUS) ×1 IMPLANT
GLOVE BIOGEL PI IND STRL 7.0 (GLOVE) ×2 IMPLANT
LENS IOL TECNIS EYHANCE 19.0 (Intraocular Lens) IMPLANT
NDL HYPO 18GX1.5 BLUNT FILL (NEEDLE) ×1 IMPLANT
NEEDLE HYPO 18GX1.5 BLUNT FILL (NEEDLE) ×1 IMPLANT
PAD ARMBOARD 7.5X6 YLW CONV (MISCELLANEOUS) ×1 IMPLANT
RING MALYGIN 7.0 (MISCELLANEOUS) IMPLANT
SYR TB 1ML LL NO SAFETY (SYRINGE) ×1 IMPLANT
TAPE SURG TRANSPORE 1 IN (GAUZE/BANDAGES/DRESSINGS) IMPLANT
WATER STERILE IRR 250ML POUR (IV SOLUTION) ×1 IMPLANT

## 2023-04-02 NOTE — Anesthesia Preprocedure Evaluation (Signed)
Anesthesia Evaluation  Patient identified by MRN, date of birth, ID band Patient awake    Reviewed: Allergy & Precautions, H&P , NPO status , Patient's Chart, lab work & pertinent test results, reviewed documented beta blocker date and time   History of Anesthesia Complications (+) history of anesthetic complications  Airway Mallampati: II  TM Distance: >3 FB Neck ROM: full    Dental no notable dental hx.    Pulmonary neg pulmonary ROS, asthma , COPD, Current Smoker and Patient abstained from smoking.   Pulmonary exam normal breath sounds clear to auscultation       Cardiovascular Exercise Tolerance: Good negative cardio ROS  Rhythm:regular Rate:Normal     Neuro/Psych negative neurological ROS  negative psych ROS   GI/Hepatic negative GI ROS, Neg liver ROS,GERD  ,,  Endo/Other  negative endocrine ROSdiabetes    Renal/GU Renal diseasenegative Renal ROS  negative genitourinary   Musculoskeletal   Abdominal   Peds  Hematology negative hematology ROS (+)   Anesthesia Other Findings   Reproductive/Obstetrics negative OB ROS                             Anesthesia Physical Anesthesia Plan  ASA: 2  Anesthesia Plan: MAC   Post-op Pain Management:    Induction:   PONV Risk Score and Plan:   Airway Management Planned:   Additional Equipment:   Intra-op Plan:   Post-operative Plan:   Informed Consent: I have reviewed the patients History and Physical, chart, labs and discussed the procedure including the risks, benefits and alternatives for the proposed anesthesia with the patient or authorized representative who has indicated his/her understanding and acceptance.     Dental Advisory Given  Plan Discussed with: CRNA  Anesthesia Plan Comments:        Anesthesia Quick Evaluation

## 2023-04-02 NOTE — Anesthesia Postprocedure Evaluation (Signed)
Anesthesia Post Note  Patient: Randy Benjamin  Procedure(s) Performed: CATARACT EXTRACTION PHACO AND INTRAOCULAR LENS PLACEMENT (IOC) (Left: Eye)  Patient location during evaluation: Phase II Anesthesia Type: MAC Level of consciousness: awake Pain management: pain level controlled Vital Signs Assessment: post-procedure vital signs reviewed and stable Respiratory status: spontaneous breathing and respiratory function stable Cardiovascular status: blood pressure returned to baseline and stable Postop Assessment: no headache and no apparent nausea or vomiting Anesthetic complications: no Comments: Late entry   No notable events documented.   Last Vitals:  Vitals:   04/02/23 0904 04/02/23 1015  BP: 113/81 93/60  Pulse: (!) 48 (!) 52  Resp: 15 14  Temp: 36.9 C (!) 36.4 C  SpO2: 100% 97%    Last Pain:  Vitals:   04/02/23 1016  TempSrc:   PainSc: 0-No pain                 Windell Norfolk

## 2023-04-02 NOTE — Transfer of Care (Signed)
Immediate Anesthesia Transfer of Care Note  Patient: Randy Benjamin  Procedure(s) Performed: CATARACT EXTRACTION PHACO AND INTRAOCULAR LENS PLACEMENT (IOC) (Left: Eye)  Patient Location: PACU and Short Stay  Anesthesia Type:MAC  Level of Consciousness: awake, alert , and oriented  Airway & Oxygen Therapy: Patient Spontanous Breathing  Post-op Assessment: Report given to RN  Post vital signs: Reviewed and stable  Last Vitals:  Vitals Value Taken Time  BP 93/60 04/02/23 1015  Temp 36.4 C 04/02/23 1015  Pulse 52 04/02/23 1015  Resp 14 04/02/23 1015  SpO2 97 % 04/02/23 1015    Last Pain:  Vitals:   04/02/23 1015  TempSrc: Oral  PainSc:          Complications: No notable events documented.

## 2023-04-02 NOTE — Discharge Instructions (Signed)
Please discharge patient when stable, will follow up today with Dr. Elston Aldape at the [facility] [place] office immediately following discharge.  Leave shield in place until visit.  All paperwork with discharge instructions will be given at the office.  [facility] Address:  [address]  [place], [place] [address]  

## 2023-04-02 NOTE — Op Note (Signed)
Date of procedure: 04/02/23  Pre-operative diagnosis: Visually significant age-related combined cataract, Left Eye (H25.812)  Post-operative diagnosis: Visually significant age-related combined cataract, Left Eye (H25.812)  Procedure: Removal of cataract via phacoemulsification and insertion of intra-ocular lens Laural Benes and Johnson DIB00 +19.0D into the capsular bag of the Left Eye  Attending surgeon: Rudy Jew. Mekenna Finau, MD, MA  Anesthesia: MAC, Topical Akten  Complications: None  Estimated Blood Loss: <57mL (minimal)  Specimens: None  Implants: As above  Indications:  Visually significant age-related cataract, Left Eye  Procedure:  The patient was seen and identified in the pre-operative area. The operative eye was identified and dilated.  The operative eye was marked.  Topical anesthesia was administered to the operative eye.     The patient was then to the operative suite and placed in the supine position.  A timeout was performed confirming the patient, procedure to be performed, and all other relevant information.   The patient's face was prepped and draped in the usual fashion for intra-ocular surgery.  A lid speculum was placed into the operative eye and the surgical microscope moved into place and focused.  An inferotemporal paracentesis was created using a 20 gauge paracentesis blade.  Shugarcaine was injected into the anterior chamber.  Viscoelastic was injected into the anterior chamber.  A temporal clear-corneal main wound incision was created using a 2.61mm microkeratome.  A continuous curvilinear capsulorrhexis was initiated using an irrigating cystitome and completed using capsulorrhexis forceps.  Hydrodissection and hydrodeliniation were performed.  Viscoelastic was injected into the anterior chamber.  A phacoemulsification handpiece and a chopper as a second instrument were used to remove the nucleus and epinucleus. The irrigation/aspiration handpiece was used to remove any  remaining cortical material.   The capsular bag was reinflated with viscoelastic, checked, and found to be intact.  The intraocular lens was inserted into the capsular bag.  The irrigation/aspiration handpiece was used to remove any remaining viscoelastic.  The clear corneal wound and paracentesis wounds were then hydrated and checked with Weck-Cels to be watertight. Maxitrol drops were instilled into the operative eye. The lid-speculum was removed.  The drape was removed.  The patient's face was cleaned with a wet and dry 4x4.    A clear shield was taped over the eye. The patient was taken to the post-operative care unit in good condition, having tolerated the procedure well.  Post-Op Instructions: The patient will follow up at [facility] for a same day post-operative evaluation and will receive all other orders and instructions.

## 2023-04-02 NOTE — Interval H&P Note (Signed)
History and Physical Interval Note:  04/02/2023 9:48 AM  Randy Benjamin  has presented today for surgery, with the diagnosis of combined forms age related cataract; left.  The various methods of treatment have been discussed with the patient and family. After consideration of risks, benefits and other options for treatment, the patient has consented to  Procedure(s) with comments: CATARACT EXTRACTION PHACO AND INTRAOCULAR LENS PLACEMENT (IOC) (Left) - CDE: as a surgical intervention.  The patient's history has been reviewed, patient examined, no change in status, stable for surgery.  I have reviewed the patient's chart and labs.  Questions were answered to the patient's satisfaction.     [name]

## 2023-04-06 ENCOUNTER — Encounter (HOSPITAL_COMMUNITY): Payer: Self-pay | Admitting: Ophthalmology

## 2023-04-12 DIAGNOSIS — H25811 Combined forms of age-related cataract, right eye: Secondary | ICD-10-CM | POA: Diagnosis not present

## 2023-04-13 ENCOUNTER — Encounter (HOSPITAL_COMMUNITY)
Admission: RE | Admit: 2023-04-13 | Discharge: 2023-04-13 | Disposition: A | Discharge status: Home / Self Care | Payer: 59 | Source: Ambulatory Visit | Attending: Ophthalmology | Admitting: Ophthalmology

## 2023-04-13 ENCOUNTER — Encounter (HOSPITAL_COMMUNITY): Payer: Self-pay

## 2023-04-16 ENCOUNTER — Ambulatory Visit (HOSPITAL_COMMUNITY)
Admission: RE | Admit: 2023-04-16 | Discharge: 2023-04-16 | Disposition: A | Discharge status: Home / Self Care | Payer: 59 | Attending: Ophthalmology | Admitting: Ophthalmology

## 2023-04-16 ENCOUNTER — Encounter (HOSPITAL_COMMUNITY): Payer: Self-pay | Admitting: Ophthalmology

## 2023-04-16 ENCOUNTER — Ambulatory Visit (HOSPITAL_BASED_OUTPATIENT_CLINIC_OR_DEPARTMENT_OTHER): Payer: 59 | Admitting: Anesthesiology

## 2023-04-16 ENCOUNTER — Encounter (HOSPITAL_COMMUNITY)
Admission: RE | Disposition: A | Discharge status: Home / Self Care | Payer: Self-pay | Source: Home / Self Care | Attending: Ophthalmology

## 2023-04-16 ENCOUNTER — Ambulatory Visit (HOSPITAL_COMMUNITY): Payer: 59 | Admitting: Anesthesiology

## 2023-04-16 DIAGNOSIS — J449 Chronic obstructive pulmonary disease, unspecified: Secondary | ICD-10-CM

## 2023-04-16 DIAGNOSIS — F17210 Nicotine dependence, cigarettes, uncomplicated: Secondary | ICD-10-CM | POA: Diagnosis not present

## 2023-04-16 DIAGNOSIS — I4891 Unspecified atrial fibrillation: Secondary | ICD-10-CM | POA: Insufficient documentation

## 2023-04-16 DIAGNOSIS — Z961 Presence of intraocular lens: Secondary | ICD-10-CM | POA: Insufficient documentation

## 2023-04-16 DIAGNOSIS — Z9842 Cataract extraction status, left eye: Secondary | ICD-10-CM | POA: Diagnosis not present

## 2023-04-16 DIAGNOSIS — H25811 Combined forms of age-related cataract, right eye: Secondary | ICD-10-CM | POA: Diagnosis not present

## 2023-04-16 DIAGNOSIS — E1136 Type 2 diabetes mellitus with diabetic cataract: Secondary | ICD-10-CM | POA: Insufficient documentation

## 2023-04-16 HISTORY — PX: CATARACT EXTRACTION W/PHACO: SHX586

## 2023-04-16 LAB — GLUCOSE, CAPILLARY: Glucose-Capillary: 99 mg/dL (ref 70–99)

## 2023-04-16 SURGERY — PHACOEMULSIFICATION, CATARACT, WITH IOL INSERTION
Anesthesia: Monitor Anesthesia Care | Site: Eye | Laterality: Right

## 2023-04-16 MED ORDER — NEOMYCIN-POLYMYXIN-DEXAMETH 3.5-10000-0.1 OP SUSP
OPHTHALMIC | Status: DC | PRN
  Administered 2023-04-16: 2 [drp] via OPHTHALMIC

## 2023-04-16 MED ORDER — POVIDONE-IODINE 5 % OP SOLN
OPHTHALMIC | Status: DC | PRN
  Administered 2023-04-16: 1 via OPHTHALMIC

## 2023-04-16 MED ORDER — SODIUM HYALURONATE 10 MG/ML IO SOLUTION
PREFILLED_SYRINGE | INTRAOCULAR | Status: DC | PRN
  Administered 2023-04-16: .85 mL via INTRAOCULAR

## 2023-04-16 MED ORDER — LIDOCAINE HCL (PF) 1 % IJ SOLN
INTRAOCULAR | Status: DC | PRN
  Administered 2023-04-16: 1 mL via OPHTHALMIC

## 2023-04-16 MED ORDER — STERILE WATER FOR IRRIGATION IR SOLN
Status: DC | PRN
  Administered 2023-04-16: 250 mL

## 2023-04-16 MED ORDER — LIDOCAINE HCL 3.5 % OP GEL
1.0000 | Freq: Once | OPHTHALMIC | Status: AC
  Administered 2023-04-16: 1 via OPHTHALMIC

## 2023-04-16 MED ORDER — EPINEPHRINE PF 1 MG/ML IJ SOLN
INTRAMUSCULAR | Status: AC
  Filled 2023-04-16: qty 1

## 2023-04-16 MED ORDER — TROPICAMIDE 1 % OP SOLN
1.0000 [drp] | OPHTHALMIC | Status: AC | PRN
  Administered 2023-04-16 (×3): 1 [drp] via OPHTHALMIC

## 2023-04-16 MED ORDER — SODIUM CHLORIDE 0.9% FLUSH
INTRAVENOUS | Status: DC | PRN
  Administered 2023-04-16 (×3): 5 mL via INTRAVENOUS

## 2023-04-16 MED ORDER — ONDANSETRON HCL 4 MG/2ML IJ SOLN
INTRAMUSCULAR | Status: DC | PRN
  Administered 2023-04-16: 4 mg via INTRAVENOUS

## 2023-04-16 MED ORDER — MIDAZOLAM HCL 2 MG/2ML IJ SOLN
INTRAMUSCULAR | Status: DC | PRN
  Administered 2023-04-16 (×2): 1 mg via INTRAVENOUS

## 2023-04-16 MED ORDER — FENTANYL CITRATE (PF) 100 MCG/2ML IJ SOLN
INTRAMUSCULAR | Status: DC | PRN
  Administered 2023-04-16: 50 ug via INTRAVENOUS

## 2023-04-16 MED ORDER — ONDANSETRON HCL 4 MG/2ML IJ SOLN
INTRAMUSCULAR | Status: AC
  Filled 2023-04-16: qty 2

## 2023-04-16 MED ORDER — PHENYLEPHRINE HCL 2.5 % OP SOLN
1.0000 [drp] | OPHTHALMIC | Status: AC | PRN
  Administered 2023-04-16 (×3): 1 [drp] via OPHTHALMIC

## 2023-04-16 MED ORDER — LACTATED RINGERS IV SOLN: INTRAVENOUS | Status: DC

## 2023-04-16 MED ORDER — MIDAZOLAM HCL 2 MG/2ML IJ SOLN
INTRAMUSCULAR | Status: AC
  Filled 2023-04-16: qty 2

## 2023-04-16 MED ORDER — TETRACAINE HCL 0.5 % OP SOLN
1.0000 [drp] | OPHTHALMIC | Status: AC | PRN
  Administered 2023-04-16 (×3): 1 [drp] via OPHTHALMIC

## 2023-04-16 MED ORDER — SODIUM HYALURONATE 23MG/ML IO SOSY
PREFILLED_SYRINGE | INTRAOCULAR | Status: DC | PRN
  Administered 2023-04-16: .6 mL via INTRAOCULAR

## 2023-04-16 MED ORDER — BSS IO SOLN
INTRAOCULAR | Status: DC | PRN
  Administered 2023-04-16: 15 mL via INTRAOCULAR

## 2023-04-16 MED ORDER — FENTANYL CITRATE (PF) 100 MCG/2ML IJ SOLN
INTRAMUSCULAR | Status: AC
  Filled 2023-04-16: qty 2

## 2023-04-16 MED ORDER — EPINEPHRINE PF 1 MG/ML IJ SOLN
INTRAOCULAR | Status: DC | PRN
  Administered 2023-04-16: 500 mL

## 2023-04-16 MED ORDER — PHENYLEPHRINE-KETOROLAC 1-0.3 % IO SOLN
INTRAOCULAR | Status: AC
  Filled 2023-04-16: qty 4

## 2023-04-16 SURGICAL SUPPLY — 14 items
CATARACT SUITE SIGHTPATH (MISCELLANEOUS) ×1 IMPLANT
CLOTH BEACON ORANGE TIMEOUT ST (SAFETY) ×1 IMPLANT
EYE SHIELD UNIVERSAL CLEAR (GAUZE/BANDAGES/DRESSINGS) IMPLANT
FEE CATARACT SUITE SIGHTPATH (MISCELLANEOUS) ×1 IMPLANT
GLOVE BIOGEL PI IND STRL 7.0 (GLOVE) ×2 IMPLANT
LENS IOL TECNIS EYHANCE 19.0 (Intraocular Lens) IMPLANT
NDL HYPO 18GX1.5 BLUNT FILL (NEEDLE) ×1 IMPLANT
NEEDLE HYPO 18GX1.5 BLUNT FILL (NEEDLE) ×1 IMPLANT
PAD ARMBOARD 7.5X6 YLW CONV (MISCELLANEOUS) ×1 IMPLANT
RING MALYGIN 7.0 (MISCELLANEOUS) IMPLANT
SYR TB 1ML LL NO SAFETY (SYRINGE) ×1 IMPLANT
TAPE SURG TRANSPORE 1 IN (GAUZE/BANDAGES/DRESSINGS) IMPLANT
TIP IRRIGATON/ASPIRATION (MISCELLANEOUS) IMPLANT
WATER STERILE IRR 250ML POUR (IV SOLUTION) ×1 IMPLANT

## 2023-04-16 NOTE — Discharge Instructions (Addendum)
Please discharge patient when stable, will follow up today with Dr. Wrzosek at the [facility] office immediately following discharge.  Leave shield in place until visit.  All paperwork with discharge instructions will be given at the office.  [facility] Address:  [address]  [place], [place] [address]  

## 2023-04-16 NOTE — Anesthesia Postprocedure Evaluation (Signed)
Anesthesia Post Note  Patient: CREWE HEATHMAN  Procedure(s) Performed: CATARACT EXTRACTION PHACO AND INTRAOCULAR LENS PLACEMENT (IOC) (Right: Eye)  Patient location during evaluation: Phase II Anesthesia Type: MAC Level of consciousness: awake and alert and oriented Pain management: pain level controlled Vital Signs Assessment: post-procedure vital signs reviewed and stable Respiratory status: spontaneous breathing, nonlabored ventilation and respiratory function stable Cardiovascular status: blood pressure returned to baseline and stable Postop Assessment: no apparent nausea or vomiting Anesthetic complications: no  No notable events documented.   Last Vitals:  Vitals:   04/16/23 0921 04/16/23 1054  BP: (!) 144/61 108/61  Pulse: (!) 52 (!) 52  Resp: (!) 24 12  Temp: 36.7 C (!) 36.4 C  SpO2: 95% 98%    Last Pain:  Vitals:   04/16/23 1054  TempSrc: Oral  PainSc: 0-No pain                 Annmargaret Decaprio C Jazel Nimmons

## 2023-04-16 NOTE — Transfer of Care (Signed)
Immediate Anesthesia Transfer of Care Note  Patient: Randy Benjamin  Procedure(s) Performed: CATARACT EXTRACTION PHACO AND INTRAOCULAR LENS PLACEMENT (IOC) (Right: Eye)  Patient Location: Short Stay  Anesthesia Type:MAC  Level of Consciousness: awake, alert , and oriented  Airway & Oxygen Therapy: Patient Spontanous Breathing  Post-op Assessment: Report given to RN and Post -op Vital signs reviewed and stable  Post vital signs: Reviewed and stable  Last Vitals:  Vitals Value Taken Time  BP    Temp    Pulse    Resp    SpO2      Last Pain:  Vitals:   04/16/23 0913  PainSc: 0-No pain         Complications: No notable events documented.

## 2023-04-16 NOTE — Anesthesia Procedure Notes (Signed)
Procedure Name: MAC Date/Time: 04/16/2023 10:35 AM  Performed by: Julian Reil, CRNAPre-anesthesia Checklist: Patient identified, Emergency Drugs available, Suction available and Patient being monitored Patient Re-evaluated:Patient Re-evaluated prior to induction Oxygen Delivery Method: Nasal cannula Placement Confirmation: positive ETCO2

## 2023-04-16 NOTE — Interval H&P Note (Signed)
History and Physical Interval Note:  04/16/2023 10:28 AM  Randy Benjamin  has presented today for surgery, with the diagnosis of combined forms age related cataract, right eye.  The various methods of treatment have been discussed with the patient and family. After consideration of risks, benefits and other options for treatment, the patient has consented to  Procedure(s): CATARACT EXTRACTION PHACO AND INTRAOCULAR LENS PLACEMENT (IOC) (Right) as a surgical intervention.  The patient's history has been reviewed, patient examined, no change in status, stable for surgery.  I have reviewed the patient's chart and labs.  Questions were answered to the patient's satisfaction.     [name]

## 2023-04-16 NOTE — H&P (Signed)
Surgical History & Physical Patient Name: Randy Benjamin DOB: 07-27-1951 Surgery: Surgery plan; SITE Surgeon: [name] MD Surgery Date: Pre-Op Date: HPI: A 33 Yr. old male patient present for 10 day post op OS. Seeing like a 2 dimension on his phone with both eyes open. He is still seeing the floater OS but no worse or changes in it. Denies FOLs. Difficulties reading small print, recognizing peoples faces from a distance until he is right up on them, this is due to hazy/blurred vision OD. This is negatively affecting the patient's quality of life and the patient is unable to function adequately in life with the current level of vision. HPI was performed by [name] . Medical History: Ocular Patient: Blood Relative: Cataracts  None General Health Review of Systems  None Negative Allergic/Immunologic Negative Cardiovascular Negative Constitutional Negative Ear, Nose, Mouth & Throat Negative Endocrine Negative Eyes Negative Gastrointestinal Negative Genitourinary Negative Hemotologic/Lymphatic Negative Integumentary Negative Musculoskeletal Negative Neurological Negative Psychiatry Negative Respiratory Social   Current every day smoker / [id number] of Cigarettes  Medication Ocular Systemic Ilevro, Prednisolone,  Blood thinner (unsure why he is on this),  Sx/Procedures Ocular Other Phaco c IOL OS,  Oral Surgery,  Drug Allergies  Penicillin,   None ROS: History & Physical: Heent: NECK: supple without bruits LUNGS: lungs clear to auscultation CV: regular rate and rhythm Abdomen: soft and non-tender Impression & Plan: Assessment: 1.  CATARACT EXTRACTION STATUS; Left Eye (Z98.42) 2.  COMBINED FORMS AGE RELATED CATARACT; Right Eye (H25.811) 3.  INTRAOCULAR LENS IOL ; Left Eye (Z96.1)  Plan: 1.  10 days after cataract surgery. Doing well with improved vision and normal eye pressure. Call with any problems or concerns. Stop Vigamox. Continue Ilevro 1 drop 1x/day  for 3 more weeks. Continue Pred Acetate 1 drop 2x/day for 3 more weeks.  2.  Cataract accounts for the patient's decreased vision. This visual impairment is not correctable with a tolerable change in glasses or contact lenses. Cataract surgery with an implantation of a new lens should significantly improve the visual and functional status of the patient. Discussed all risks, benefits, alternatives, and potential complications. Discussed the procedures and recovery. Patient desires to have surgery. A-scan ordered and performed today for intra-ocular lens calculations. The surgery will be performed in order to improve vision for driving, reading, and for eye examinations. Recommend phacoemulsification with intra-ocular lens. Recommend Dextenza for post-operative pain and inflammation. Right Eye. Surgery required to correct imbalance of vision. Dilates well - shugarcaine by protocol.  3.  Doing well since surgery Continue Post-op medications Sincerely, Signature: alt image [name] Date: 04-16-2023

## 2023-04-16 NOTE — Op Note (Signed)
Date of procedure: 04/16/23  Pre-operative diagnosis:  Visually significant combined form age-related cataract, Right Eye (H25.811)  Post-operative diagnosis:  Visually significant combined form age-related cataract, Right Eye (H25.811)  Procedure: Removal of cataract via phacoemulsification and insertion of intra-ocular lens Laural Benes and Johnson DIB00 +19.0D into the capsular bag of the Right Eye  Attending surgeon: Rudy Jew. Dynasty Holquin, MD, MA  Anesthesia: MAC, Topical Akten  Complications: None  Estimated Blood Loss: <64mL (minimal)  Specimens: None  Implants: As above  Indications:  Visually significant age-related cataract, Right Eye  Procedure:  The patient was seen and identified in the pre-operative area. The operative eye was identified and dilated.  The operative eye was marked.  Topical anesthesia was administered to the operative eye.     The patient was then to the operative suite and placed in the supine position.  A timeout was performed confirming the patient, procedure to be performed, and all other relevant information.   The patient's face was prepped and draped in the usual fashion for intra-ocular surgery.  A lid speculum was placed into the operative eye and the surgical microscope moved into place and focused.  A superotemporal paracentesis was created using a 20 gauge paracentesis blade.  Shugarcaine was injected into the anterior chamber.  Viscoelastic was injected into the anterior chamber.  A temporal clear-corneal main wound incision was created using a 2.69mm microkeratome.  A continuous curvilinear capsulorrhexis was initiated using an irrigating cystitome and completed using capsulorrhexis forceps.  Hydrodissection and hydrodeliniation were performed.  Viscoelastic was injected into the anterior chamber.  A phacoemulsification handpiece and a chopper as a second instrument were used to remove the nucleus and epinucleus. The irrigation/aspiration handpiece was used to  remove any remaining cortical material.   The capsular bag was reinflated with viscoelastic, checked, and found to be intact.  The intraocular lens was inserted into the capsular bag.  The irrigation/aspiration handpiece was used to remove any remaining viscoelastic.  The clear corneal wound and paracentesis wounds were then hydrated and checked with Weck-Cels to be watertight. Maxitrol drops were instilled into the operative eye.  The lid-speculum was removed.  The drape was removed.  The patient's face was cleaned with a wet and dry 4x4. A clear shield was taped over the eye. The patient was taken to the post-operative care unit in good condition, having tolerated the procedure well.  Post-Op Instructions: The patient will follow up at [facility] for a same day post-operative evaluation and will receive all other orders and instructions.

## 2023-04-16 NOTE — Anesthesia Preprocedure Evaluation (Signed)
Anesthesia Evaluation  Patient identified by MRN, date of birth, ID band Patient awake    Reviewed: Allergy & Precautions, H&P , NPO status , Patient's Chart, lab work & pertinent test results, reviewed documented beta blocker date and time   History of Anesthesia Complications (+) PROLONGED EMERGENCE and history of anesthetic complications  Airway Mallampati: II  TM Distance: >3 FB Neck ROM: Full    Dental  (+) Upper Dentures   Pulmonary asthma , COPD, Current Smoker and Patient abstained from smoking.   Pulmonary exam normal breath sounds clear to auscultation       Cardiovascular Exercise Tolerance: Good (-) hypertensionPt. on home beta blockers Normal cardiovascular exam+ dysrhythmias Atrial Fibrillation  Rhythm:Regular Rate:Normal     Neuro/Psych negative neurological ROS  negative psych ROS   GI/Hepatic Neg liver ROS,GERD  Controlled,,  Endo/Other  diabetes, Well Controlled, Type 2    Renal/GU Renal disease  negative genitourinary   Musculoskeletal negative musculoskeletal ROS (+)    Abdominal   Peds negative pediatric ROS (+)  Hematology negative hematology ROS (+)   Anesthesia Other Findings   Reproductive/Obstetrics negative OB ROS                             Anesthesia Physical Anesthesia Plan  ASA: 3  Anesthesia Plan: MAC   Post-op Pain Management: Minimal or no pain anticipated   Induction:   PONV Risk Score and Plan: Treatment may vary due to age or medical condition  Airway Management Planned: Nasal Cannula and Natural Airway  Additional Equipment:   Intra-op Plan:   Post-operative Plan:   Informed Consent: I have reviewed the patients History and Physical, chart, labs and discussed the procedure including the risks, benefits and alternatives for the proposed anesthesia with the patient or authorized representative who has indicated his/her understanding and  acceptance.     Dental advisory given  Plan Discussed with: CRNA and Surgeon  Anesthesia Plan Comments:         Anesthesia Quick Evaluation

## 2023-04-20 ENCOUNTER — Encounter (HOSPITAL_COMMUNITY): Payer: Self-pay | Admitting: Ophthalmology

## 2023-06-02 DIAGNOSIS — Z7984 Long term (current) use of oral hypoglycemic drugs: Secondary | ICD-10-CM | POA: Diagnosis not present

## 2023-06-02 DIAGNOSIS — M79662 Pain in left lower leg: Secondary | ICD-10-CM | POA: Diagnosis not present

## 2023-06-02 DIAGNOSIS — E119 Type 2 diabetes mellitus without complications: Secondary | ICD-10-CM | POA: Diagnosis not present

## 2023-06-02 DIAGNOSIS — I4891 Unspecified atrial fibrillation: Secondary | ICD-10-CM | POA: Diagnosis not present

## 2023-06-02 DIAGNOSIS — M7122 Synovial cyst of popliteal space [Baker], left knee: Secondary | ICD-10-CM | POA: Diagnosis not present

## 2023-06-02 DIAGNOSIS — M11261 Other chondrocalcinosis, right knee: Secondary | ICD-10-CM | POA: Diagnosis not present

## 2023-06-02 DIAGNOSIS — F17210 Nicotine dependence, cigarettes, uncomplicated: Secondary | ICD-10-CM | POA: Diagnosis not present

## 2023-06-02 DIAGNOSIS — Z5900 Homelessness unspecified: Secondary | ICD-10-CM | POA: Diagnosis not present

## 2023-06-02 DIAGNOSIS — Z880 Allergy status to penicillin: Secondary | ICD-10-CM | POA: Diagnosis not present

## 2023-06-02 DIAGNOSIS — M11262 Other chondrocalcinosis, left knee: Secondary | ICD-10-CM | POA: Diagnosis not present

## 2023-06-02 DIAGNOSIS — M1120 Other chondrocalcinosis, unspecified site: Secondary | ICD-10-CM | POA: Diagnosis not present

## 2023-06-02 DIAGNOSIS — M25562 Pain in left knee: Secondary | ICD-10-CM | POA: Diagnosis not present

## 2023-06-02 DIAGNOSIS — Z886 Allergy status to analgesic agent status: Secondary | ICD-10-CM | POA: Diagnosis not present

## 2023-06-02 DIAGNOSIS — E785 Hyperlipidemia, unspecified: Secondary | ICD-10-CM | POA: Diagnosis not present

## 2023-06-02 DIAGNOSIS — M79604 Pain in right leg: Secondary | ICD-10-CM | POA: Diagnosis not present

## 2023-06-02 DIAGNOSIS — I959 Hypotension, unspecified: Secondary | ICD-10-CM | POA: Diagnosis not present

## 2023-06-02 DIAGNOSIS — M25561 Pain in right knee: Secondary | ICD-10-CM | POA: Diagnosis not present

## 2023-06-02 DIAGNOSIS — Z79899 Other long term (current) drug therapy: Secondary | ICD-10-CM | POA: Diagnosis not present

## 2023-06-02 DIAGNOSIS — M79661 Pain in right lower leg: Secondary | ICD-10-CM | POA: Diagnosis not present

## 2023-06-02 DIAGNOSIS — R079 Chest pain, unspecified: Secondary | ICD-10-CM | POA: Diagnosis not present

## 2023-06-02 DIAGNOSIS — R262 Difficulty in walking, not elsewhere classified: Secondary | ICD-10-CM | POA: Diagnosis not present

## 2023-06-02 DIAGNOSIS — M79605 Pain in left leg: Secondary | ICD-10-CM | POA: Diagnosis not present

## 2024-05-28 ENCOUNTER — Emergency Department (HOSPITAL_COMMUNITY)

## 2024-05-28 ENCOUNTER — Emergency Department (HOSPITAL_COMMUNITY)
Admission: EM | Admit: 2024-05-28 | Discharge: 2024-05-28 | Disposition: A | Discharge status: Home / Self Care | Attending: Emergency Medicine | Admitting: Emergency Medicine

## 2024-05-28 ENCOUNTER — Encounter (HOSPITAL_COMMUNITY): Payer: Self-pay | Admitting: Emergency Medicine

## 2024-05-28 ENCOUNTER — Other Ambulatory Visit: Payer: Self-pay

## 2024-05-28 DIAGNOSIS — S32018A Other fracture of first lumbar vertebra, initial encounter for closed fracture: Secondary | ICD-10-CM | POA: Insufficient documentation

## 2024-05-28 DIAGNOSIS — M549 Dorsalgia, unspecified: Secondary | ICD-10-CM | POA: Diagnosis not present

## 2024-05-28 DIAGNOSIS — M545 Low back pain, unspecified: Secondary | ICD-10-CM | POA: Diagnosis not present

## 2024-05-28 DIAGNOSIS — E119 Type 2 diabetes mellitus without complications: Secondary | ICD-10-CM | POA: Insufficient documentation

## 2024-05-28 DIAGNOSIS — S32010A Wedge compression fracture of first lumbar vertebra, initial encounter for closed fracture: Secondary | ICD-10-CM

## 2024-05-28 DIAGNOSIS — Z7901 Long term (current) use of anticoagulants: Secondary | ICD-10-CM | POA: Insufficient documentation

## 2024-05-28 DIAGNOSIS — M47816 Spondylosis without myelopathy or radiculopathy, lumbar region: Secondary | ICD-10-CM | POA: Diagnosis not present

## 2024-05-28 DIAGNOSIS — W28XXXA Contact with powered lawn mower, initial encounter: Secondary | ICD-10-CM | POA: Insufficient documentation

## 2024-05-28 MED ORDER — CYCLOBENZAPRINE HCL 10 MG PO TABS
10.0000 mg | ORAL_TABLET | Freq: Once | ORAL | Status: AC
  Administered 2024-05-28: 10 mg via ORAL
  Filled 2024-05-28: qty 1

## 2024-05-28 MED ORDER — PREDNISONE 20 MG PO TABS: 40.0000 mg | ORAL_TABLET | Freq: Every day | ORAL | 0 refills | Status: AC

## 2024-05-28 MED ORDER — METHOCARBAMOL 500 MG PO TABS: 500.0000 mg | ORAL_TABLET | Freq: Two times a day (BID) | ORAL | 0 refills | Status: AC | PRN

## 2024-05-28 MED ORDER — ACETAMINOPHEN 325 MG PO TABS
650.0000 mg | ORAL_TABLET | Freq: Once | ORAL | Status: AC
  Administered 2024-05-28: 650 mg via ORAL
  Filled 2024-05-28: qty 2

## 2024-05-28 NOTE — Discharge Instructions (Addendum)
 It is unclear whether this is a new finding but you do have a very small compression fracture of one of the spine bones.  This may have happened today but it may have happened in the past, there was no other acute findings.  I suspect that your symptoms are related to ligaments and muscles that are now spasming.  Because you are allergic to anti-inflammatories you should not take them unless you know that you can take ibuprofen  safely.  I have given you a muscle relaxer called Robaxin .  A steroid called prednisone    Prednisone  is a steroid that helps to reduce certain types of inflammation and may be used for allergic reactions, some rashes such as poison ivy or dermatitis, for asthma attacks or bronchitis and for certain types of pain.  Please take this medicine exactly as prescribed - 40mg  by mouth daily for 5 days.  This can have certain side effects with some people including feeling like you can't sleep, feeling anxious or feeling like you are on a high.  It should not cause weight gain if only taken for a short time.  Please be aware that this medication may also cause an elevation in your blood sugar if you are a diabetic so if you are a diabetic you will need to keep a very close eye on your blood sugar, make sure that you are eating an extremely low level of carbohydrates and taking your medications exactly as prescribed.  If you should develop severe high blood sugar or start to feel poorly return to the emergency department immediately     Please take Robaxin , 500 mg up to 2 or 3 times a day as needed for muscle spasm, this is a muscle relaxer, it may cause generalized weakness, sleepiness and you should not drive or do important things while taking this medication.  This includes driving a vehicle or taking care of young children, these things should not be done while taking this medication.    Thank you for allowing us  to treat you in the emergency department today.  After reviewing your  examination and potential testing that was done it appears that you are safe to go home.  I would like for you to follow-up with your doctor within the next several days, have them obtain your records and follow-up with them to review all potential tests and results from your visit.  If you should develop severe or worsening symptoms return to the emergency department immediately

## 2024-05-28 NOTE — ED Triage Notes (Signed)
 Pt c/o back pain after lifting a lawnmower out of the ditch.

## 2024-05-28 NOTE — ED Provider Notes (Signed)
 Randy Benjamin EMERGENCY DEPARTMENT AT [facility] Provider Note   CSN: [id number] Arrival date & time: 05/28/24  1900     Patient presents with: No chief complaint on file.   [name] is a 73 y.o. male.   HPI   This patient is a 73 year old male, he is currently on metoprolol and Eliquis, he has a history of pseudogout, history of type 2 diabetes for which he states he no longer takes medications and a history of paroxysmal atrial fibrillation.  He has never had any back surgery and in general has not had much back pain however today he was trying to lift his riding lawn more out of a ditch when it got stuck when he felt a pop across his lower back with pain radiating from his midline out across to his bilateral hips.  This is while he was trying to lift the heavy lawn more.  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  The paramedics came to pick the patient up, they found him in a sitting position with difficulty getting to his feet because of the pain in his back that gets worse when he changes position.  When he lays perfectly still he says that will hurt at all unless you touch my back or I try to move.  Prior to Admission medications   Medication Sig Start Date End Date Taking? Authorizing Provider  methocarbamol  (ROBAXIN ) 500 MG tablet Take 1 tablet (500 mg total) by mouth 2 (two) times daily as needed for muscle spasms. 05/28/24  Yes Cleotilde Rogue, MD  predniSONE  (DELTASONE ) 20 MG tablet Take 2 tablets (40 mg total) by mouth daily. 05/28/24  Yes Cleotilde Rogue, MD  apixaban (ELIQUIS) 5 MG TABS tablet Take by mouth. 03/04/22   [provider]  atorvastatin  (LIPITOR) 40 MG tablet Take 1 tablet (40 mg total) by mouth daily. 03/09/22   Zollie Lowers, MD  Blood Glucose Monitoring Suppl (POGO AUTOMATIC  BLOOD GLUCOSE) DEVI Use to monitor glucose twice daily 03/13/22   Therisa Benton PARAS, NP  Glucose Blood Automatic (POGO AUTOMATIC TEST CARTRIDGES) TEST Use to monitor glucose twice daily. 04/22/22   Therisa Benton PARAS, NP  Lancets (FREESTYLE) lancets  03/04/22   [provider]  metoprolol succinate (TOPROL-XL) 25 MG 24 hr tablet Take 0.5 tablets by mouth daily. 03/04/22 04/16/23  [provider]    Allergies: Aspirin, Catfish [fish allergy], Penicillins, and Zinc    Review of Systems  All other systems reviewed and are negative.   Updated Vital Signs BP 98/83   Pulse 64   Temp 98.1 F (36.7 C) (Oral)   Resp 16   Ht 1.854 m (6' 1)   Wt 80.7 kg   SpO2 97%   BMI 23.47 kg/m   Physical Exam Vitals and nursing note reviewed.  Constitutional:      General: He is not in acute distress.    Appearance: He is well-developed.  HENT:     Head: Normocephalic and atraumatic.     Mouth/Throat:     Pharynx: No oropharyngeal exudate.  Eyes:     General: No scleral icterus.       Right eye: No discharge.        Left eye: No discharge.  Conjunctiva/sclera: Conjunctivae normal.     Pupils: Pupils are equal, round, and reactive to light.  Neck:     Thyroid: No thyromegaly.     Vascular: No JVD.  Cardiovascular:     Rate and Rhythm: Normal rate and regular rhythm.     Heart sounds: Normal heart sounds. No murmur heard.    No friction rub. No gallop.  Pulmonary:     Effort: Pulmonary effort is normal. No respiratory distress.     Breath sounds: Normal breath sounds. No wheezing or rales.  Abdominal:     General: Bowel sounds are normal. There is no distension.     Palpations: Abdomen is soft. There is no mass.     Tenderness: There is no abdominal tenderness.  Musculoskeletal:        General: Tenderness present. Normal range of motion.     Cervical back: Normal range of motion and neck supple.     Comments: Tender across the bilateral lower back in the paraspinal  muscles and in the midline, no thoracic tenderness  Lymphadenopathy:     Cervical: No cervical adenopathy.  Skin:    General: Skin is warm and dry.     Findings: No erythema or rash.  Neurological:     Mental Status: He is alert.     Coordination: Coordination normal.     Comments: Speech is clear, cranial nerves III through XII are intact, memory is intact, strength is normal in all 4 extremities including grips and strength at the bilateral thighs, knees and ankles to extention and flexion, sensation is intact to light touch and pinprick in all 4 extremities.  Patient is able to straight leg raise bilaterally without any difficulties whatsoever, there is no weakness at the hip flexors, the knee flexors or extensors of the ankle dorsiflexors or plantar flexors.  He has normal sensation diffusely in the lower extremities and normal patellar reflexes bilaterally.  Psychiatric:        Behavior: Behavior normal.     (all labs ordered are listed, but only abnormal results are displayed) Labs Reviewed - No data to display  EKG: None  Radiology: DG Lumbar Spine Complete Result Date: 05/28/2024 CLINICAL DATA:  Back pain, lifting injury EXAM: LUMBAR SPINE - COMPLETE 4+ VIEW COMPARISON:  01/06/2011 FINDINGS: Frontal, bilateral oblique, and lateral views of the lumbar spine are obtained. There are 5 non-rib-bearing lumbar type vertebral bodies in grossly normal anatomic alignment. There is a mild age-indeterminate compression deformity through the superior endplate of the L1 vertebral body, with less than 10% loss of vertebral body height. No other acute bony abnormalities. Multilevel spondylosis and facet hypertrophy greatest at L4-5 and L5-S1. Sacroiliac joints are unremarkable. IMPRESSION: 1. Age indeterminate mild compression deformity through the superior endplate of the L1 vertebral body, with less than 10% loss of vertebral body height. 2. Multilevel lumbar degenerative changes, greatest at the  lumbosacral junction. Electronically Signed   By: Ozell Daring M.D.   On: 05/28/2024 19:56     Procedures   Medications Ordered in the ED  acetaminophen  (TYLENOL ) tablet 650 mg (has no administration in time range)  cyclobenzaprine  (FLEXERIL ) tablet 10 mg (has no administration in time range)                                    Medical Decision Making Amount and/or Complexity of Data Reviewed Radiology: ordered.  Risk OTC drugs. Prescription  drug management.   This patient is in no distress but complains of increasing back pain after trying to lift a heavy lawn more.  He does not have any neurologic symptoms, he has pain with movement and palpation but not at rest.  He declines pain medications.  Will obtain x-rays of the back to make sure there is no other acute findings such as a metastatic tumor or lesion or pathologic fracture.  The patient is agreeable to the plan   Radiology Imaging: I personally viewed the images of the ordered radiographic studies and find mild compression fracture of L1, less than 10% I agree with the radiologist interpretation as well  Allergy to NSAIDs, Tylenol  ordered, Flexeril  ordered  I have discussed with the patient at the bedside the results, and the meaning of these results.  They have had opportunity to ask questions,  expressed their understanding to the need for follow-up with primary care physician  Home with Robaxin  and prednisone , Ortho referral given     Final diagnoses:  Closed compression fracture of body of L1 vertebra [facility])    ED Discharge Orders          Ordered    predniSONE  (DELTASONE ) 20 MG tablet  Daily        05/28/24 2158    methocarbamol  (ROBAXIN ) 500 MG tablet  2 times daily PRN        05/28/24 2158               Cleotilde Rogue, MD 05/28/24 2159

## 2024-05-28 NOTE — ED Notes (Signed)
 Patient transported to X-ray

## 2024-08-15 NOTE — Progress Notes (Signed)
 Randy Benjamin                                          MRN: [id number]   08/15/2024   The [facility] Quality Team Specialist reviewed this patient medical record for the purposes of chart review for care gap closure. The following were reviewed: chart review for care gap closure-glycemic status assessment and kidney health evaluation for diabetes:eGFR  and uACR.    [facility] Quality Team

## 2024-11-17 NOTE — Progress Notes (Signed)
 Randy Benjamin                                          MRN: [id number]   11/17/2024   The [facility] Quality Team Specialist reviewed this patient medical record for the purposes of chart review for care gap closure. The following were reviewed: chart review for care gap closure-glycemic status assessment.    [facility] Quality Team

## 2024-11-24 ENCOUNTER — Encounter: Payer: Self-pay | Admitting: *Deleted

## 2024-11-24 NOTE — Progress Notes (Signed)
 Randy Benjamin                                          MRN: [id number]   11/24/2024   The [facility] Quality Team Specialist reviewed this patient medical record for the purposes of chart review for care gap closure. The following were reviewed: chart review for care gap closure-glycemic status assessment.    [facility] Quality Team
